# Patient Record
Sex: Female | Born: 1950 | Race: White | Hispanic: No | Marital: Single | Smoking: Never smoker
Health system: Southern US, Community
[De-identification: ages and names within clinical notes are randomized; demographics above are authoritative.]

## PROBLEM LIST (undated history)

## (undated) DIAGNOSIS — R011 Cardiac murmur, unspecified: Secondary | ICD-10-CM

## (undated) DIAGNOSIS — Z87442 Personal history of urinary calculi: Secondary | ICD-10-CM

## (undated) DIAGNOSIS — T4145XA Adverse effect of unspecified anesthetic, initial encounter: Secondary | ICD-10-CM

## (undated) DIAGNOSIS — I10 Essential (primary) hypertension: Secondary | ICD-10-CM

## (undated) DIAGNOSIS — I209 Angina pectoris, unspecified: Secondary | ICD-10-CM

## (undated) DIAGNOSIS — R519 Headache, unspecified: Secondary | ICD-10-CM

## (undated) DIAGNOSIS — M109 Gout, unspecified: Secondary | ICD-10-CM

## (undated) DIAGNOSIS — R51 Headache: Secondary | ICD-10-CM

## (undated) DIAGNOSIS — R600 Localized edema: Secondary | ICD-10-CM

## (undated) DIAGNOSIS — R002 Palpitations: Secondary | ICD-10-CM

## (undated) DIAGNOSIS — T8859XA Other complications of anesthesia, initial encounter: Secondary | ICD-10-CM

## (undated) HISTORY — PX: ABDOMINAL HYSTERECTOMY: SHX81

## (undated) HISTORY — PX: OOPHORECTOMY: SHX86

## (undated) HISTORY — PX: EYE SURGERY: SHX253

---

## 2004-09-19 ENCOUNTER — Emergency Department: Payer: Self-pay | Admitting: Unknown Physician Specialty

## 2004-09-28 ENCOUNTER — Inpatient Hospital Stay: Payer: Self-pay | Admitting: Otolaryngology

## 2005-07-13 ENCOUNTER — Other Ambulatory Visit: Payer: Self-pay

## 2005-07-17 ENCOUNTER — Ambulatory Visit: Payer: Self-pay

## 2006-12-19 ENCOUNTER — Emergency Department: Payer: Self-pay | Admitting: Emergency Medicine

## 2007-01-28 ENCOUNTER — Emergency Department: Payer: Self-pay | Admitting: Emergency Medicine

## 2007-01-28 ENCOUNTER — Other Ambulatory Visit: Payer: Self-pay

## 2009-11-20 ENCOUNTER — Emergency Department: Payer: Self-pay | Admitting: Emergency Medicine

## 2010-06-29 ENCOUNTER — Emergency Department: Payer: Self-pay | Admitting: Emergency Medicine

## 2010-11-04 ENCOUNTER — Emergency Department: Payer: Self-pay | Admitting: Internal Medicine

## 2012-12-09 ENCOUNTER — Ambulatory Visit: Payer: Self-pay | Admitting: Family Medicine

## 2015-09-19 MED ORDER — SCOPOLAMINE 1 MG/3DAYS TD PT72
MEDICATED_PATCH | TRANSDERMAL | Status: AC
  Filled 2015-09-19: qty 1

## 2015-09-27 ENCOUNTER — Encounter: Payer: Self-pay | Admitting: *Deleted

## 2015-10-04 ENCOUNTER — Encounter
Admission: RE | Disposition: A | Discharge status: Home / Self Care | Payer: Self-pay | Source: Ambulatory Visit | Attending: Ophthalmology

## 2015-10-04 ENCOUNTER — Ambulatory Visit: Payer: Medicare Other | Admitting: Anesthesiology

## 2015-10-04 ENCOUNTER — Encounter: Payer: Self-pay | Admitting: Anesthesiology

## 2015-10-04 ENCOUNTER — Ambulatory Visit
Admission: RE | Admit: 2015-10-04 | Discharge: 2015-10-04 | Disposition: A | Discharge status: Home / Self Care | Payer: Medicare Other | Source: Ambulatory Visit | Attending: Ophthalmology | Admitting: Ophthalmology

## 2015-10-04 DIAGNOSIS — I209 Angina pectoris, unspecified: Secondary | ICD-10-CM | POA: Diagnosis not present

## 2015-10-04 DIAGNOSIS — M109 Gout, unspecified: Secondary | ICD-10-CM | POA: Diagnosis not present

## 2015-10-04 DIAGNOSIS — G43909 Migraine, unspecified, not intractable, without status migrainosus: Secondary | ICD-10-CM | POA: Diagnosis not present

## 2015-10-04 DIAGNOSIS — M7989 Other specified soft tissue disorders: Secondary | ICD-10-CM | POA: Diagnosis not present

## 2015-10-04 DIAGNOSIS — Z90710 Acquired absence of both cervix and uterus: Secondary | ICD-10-CM | POA: Diagnosis not present

## 2015-10-04 DIAGNOSIS — R011 Cardiac murmur, unspecified: Secondary | ICD-10-CM | POA: Insufficient documentation

## 2015-10-04 DIAGNOSIS — E7800 Pure hypercholesterolemia, unspecified: Secondary | ICD-10-CM | POA: Diagnosis not present

## 2015-10-04 DIAGNOSIS — R002 Palpitations: Secondary | ICD-10-CM | POA: Insufficient documentation

## 2015-10-04 DIAGNOSIS — H2511 Age-related nuclear cataract, right eye: Secondary | ICD-10-CM | POA: Diagnosis present

## 2015-10-04 HISTORY — DX: Headache, unspecified: R51.9

## 2015-10-04 HISTORY — DX: Gout, unspecified: M10.9

## 2015-10-04 HISTORY — DX: Cardiac murmur, unspecified: R01.1

## 2015-10-04 HISTORY — DX: Localized edema: R60.0

## 2015-10-04 HISTORY — DX: Headache: R51

## 2015-10-04 HISTORY — DX: Palpitations: R00.2

## 2015-10-04 HISTORY — PX: CATARACT EXTRACTION W/PHACO: SHX586

## 2015-10-04 HISTORY — DX: Angina pectoris, unspecified: I20.9

## 2015-10-04 SURGERY — PHACOEMULSIFICATION, CATARACT, WITH IOL INSERTION
Anesthesia: Monitor Anesthesia Care | Site: Eye | Laterality: Right | Wound class: Clean

## 2015-10-04 MED ORDER — FENTANYL CITRATE (PF) 100 MCG/2ML IJ SOLN
INTRAMUSCULAR | Status: DC | PRN
  Administered 2015-10-04: 50 ug via INTRAVENOUS

## 2015-10-04 MED ORDER — EPINEPHRINE HCL 1 MG/ML IJ SOLN
INTRAMUSCULAR | Status: AC
  Filled 2015-10-04: qty 1

## 2015-10-04 MED ORDER — NA CHONDROIT SULF-NA HYALURON 40-17 MG/ML IO SOLN
INTRAOCULAR | Status: DC | PRN
  Administered 2015-10-04: 1 mL via INTRAOCULAR

## 2015-10-04 MED ORDER — TETRACAINE HCL 0.5 % OP SOLN
1.0000 [drp] | Freq: Once | OPHTHALMIC | Status: AC
  Administered 2015-10-04: 1 [drp] via OPHTHALMIC

## 2015-10-04 MED ORDER — MIDAZOLAM HCL 2 MG/2ML IJ SOLN
INTRAMUSCULAR | Status: DC | PRN
  Administered 2015-10-04 (×2): 1 mg via INTRAVENOUS

## 2015-10-04 MED ORDER — CEFUROXIME OPHTHALMIC INJECTION 1 MG/0.1 ML
INJECTION | OPHTHALMIC | Status: DC | PRN
  Administered 2015-10-04: 0.1 mL via INTRACAMERAL

## 2015-10-04 MED ORDER — CEFUROXIME OPHTHALMIC INJECTION 1 MG/0.1 ML
INJECTION | OPHTHALMIC | Status: AC
  Filled 2015-10-04: qty 0.1

## 2015-10-04 MED ORDER — MOXIFLOXACIN HCL 0.5 % OP SOLN
OPHTHALMIC | Status: AC
  Filled 2015-10-04: qty 3

## 2015-10-04 MED ORDER — EPINEPHRINE HCL 1 MG/ML IJ SOLN
INTRAOCULAR | Status: DC | PRN
  Administered 2015-10-04: 08:00:00 via OPHTHALMIC

## 2015-10-04 MED ORDER — SODIUM CHLORIDE 0.9 % IV SOLN
INTRAVENOUS | Status: DC
  Administered 2015-10-04 (×2): via INTRAVENOUS

## 2015-10-04 MED ORDER — MOXIFLOXACIN HCL 0.5 % OP SOLN
OPHTHALMIC | Status: DC | PRN
  Administered 2015-10-04: 1 [drp] via OPHTHALMIC

## 2015-10-04 MED ORDER — ARMC OPHTHALMIC DILATING GEL
1.0000 "application " | OPHTHALMIC | Status: DC | PRN
  Administered 2015-10-04 (×2): 1 via OPHTHALMIC

## 2015-10-04 MED ORDER — TETRACAINE HCL 0.5 % OP SOLN
OPHTHALMIC | Status: AC
  Filled 2015-10-04: qty 2

## 2015-10-04 MED ORDER — CARBACHOL 0.01 % IO SOLN
INTRAOCULAR | Status: DC | PRN
  Administered 2015-10-04: 0.5 mL via INTRAOCULAR

## 2015-10-04 MED ORDER — MOXIFLOXACIN HCL 0.5 % OP SOLN: 1.0000 [drp] | OPHTHALMIC | Status: DC | PRN

## 2015-10-04 MED ORDER — POVIDONE-IODINE 5 % OP SOLN
OPHTHALMIC | Status: AC
  Filled 2015-10-04: qty 30

## 2015-10-04 MED ORDER — NA CHONDROIT SULF-NA HYALURON 40-17 MG/ML IO SOLN
INTRAOCULAR | Status: AC
  Filled 2015-10-04: qty 1

## 2015-10-04 MED ORDER — POVIDONE-IODINE 5 % OP SOLN
1.0000 "application " | Freq: Once | OPHTHALMIC | Status: AC
  Administered 2015-10-04: 1 via OPHTHALMIC

## 2015-10-04 SURGICAL SUPPLY — 21 items
CANNULA ANT/CHMB 27GA (MISCELLANEOUS) ×3 IMPLANT
CUP MEDICINE 2OZ PLAST GRAD ST (MISCELLANEOUS) ×3 IMPLANT
GLOVE BIO SURGEON STRL SZ8 (GLOVE) ×3 IMPLANT
GLOVE BIOGEL M 6.5 STRL (GLOVE) ×3 IMPLANT
GLOVE SURG LX 8.0 MICRO (GLOVE) ×2
GLOVE SURG LX STRL 8.0 MICRO (GLOVE) ×1 IMPLANT
GOWN STRL REUS W/ TWL LRG LVL3 (GOWN DISPOSABLE) ×2 IMPLANT
GOWN STRL REUS W/TWL LRG LVL3 (GOWN DISPOSABLE) ×4
LENS IOL TECNIS ITEC 21.0 (Intraocular Lens) ×3 IMPLANT
PACK CATARACT (MISCELLANEOUS) ×3 IMPLANT
PACK CATARACT BRASINGTON LX (MISCELLANEOUS) ×3 IMPLANT
PACK EYE AFTER SURG (MISCELLANEOUS) ×3 IMPLANT
SOL BSS BAG (MISCELLANEOUS) ×3
SOL PREP PVP 2OZ (MISCELLANEOUS) ×3
SOLUTION BSS BAG (MISCELLANEOUS) ×1 IMPLANT
SOLUTION PREP PVP 2OZ (MISCELLANEOUS) ×1 IMPLANT
SYR 3ML LL SCALE MARK (SYRINGE) ×3 IMPLANT
SYR 5ML LL (SYRINGE) ×3 IMPLANT
SYR TB 1ML 27GX1/2 LL (SYRINGE) ×3 IMPLANT
WATER STERILE IRR 1000ML POUR (IV SOLUTION) ×3 IMPLANT
WIPE NON LINTING 3.25X3.25 (MISCELLANEOUS) ×3 IMPLANT

## 2015-10-04 NOTE — H&P (Signed)
  All labs reviewed. Abnormal studies sent to patients PCP when indicated.  Previous H&P reviewed, patient examined, there are NO CHANGES.  Heather Simmons LOUIS5/30/20177:47 AM

## 2015-10-04 NOTE — Anesthesia Postprocedure Evaluation (Incomplete)
Anesthesia Post Note  Patient: Heather Simmons  Procedure(s) Performed: Procedure(s) (LRB): CATARACT EXTRACTION PHACO AND INTRAOCULAR LENS PLACEMENT (IOC) (Right)  Anesthesia Post Evaluation  Last Vitals:  Filed Vitals:   10/04/15 0653  BP: 136/81  Pulse: 72  Temp: 36.9 C  Resp: 18    Last Pain: There were no vitals filed for this visit.               F[place],  Diego Cory

## 2015-10-04 NOTE — Anesthesia Preprocedure Evaluation (Signed)
Anesthesia Evaluation  Patient identified by MRN, date of birth, ID band Patient awake    Reviewed: Allergy & Precautions, H&P , NPO status , Patient's Chart, lab work & pertinent test results  History of Anesthesia Complications Negative for: history of anesthetic complications  Airway Mallampati: III  TM Distance: <3 FB Neck ROM: limited    Dental  (+) Poor Dentition, Chipped, Missing   Pulmonary neg pulmonary ROS, neg shortness of breath,    Pulmonary exam normal breath sounds clear to auscultation       Cardiovascular Exercise Tolerance: Good (-) angina(-) Past MI and (-) DOE Normal cardiovascular exam+ Valvular Problems/Murmurs  Rhythm:regular Rate:Normal     Neuro/Psych  Headaches, negative psych ROS   GI/Hepatic negative GI ROS, Neg liver ROS, neg GERD  ,  Endo/Other  negative endocrine ROS  Renal/GU negative Renal ROS  negative genitourinary   Musculoskeletal   Abdominal   Peds  Hematology negative hematology ROS (+)   Anesthesia Other Findings Past Medical History:   Heart murmur                                                 Headache                                                       Comment:MIGRAINES   Gout                                                         Anginal pain (HCC)                                           Palpitations                                                 Fluid collection (edema) in the arms, legs, ha*             Past Surgical History:   ABDOMINAL HYSTERECTOMY                                       BMI    Body Mass Index   42.58 kg/m 2      Reproductive/Obstetrics negative OB ROS                             Anesthesia Physical Anesthesia Plan  ASA: III  Anesthesia Plan: MAC   Post-op Pain Management:    Induction:   Airway Management Planned:   Additional Equipment:   Intra-op Plan:   Post-operative Plan:   Informed  Consent: I have reviewed the patients History and Physical, chart, labs  and discussed the procedure including the risks, benefits and alternatives for the proposed anesthesia with the patient or authorized representative who has indicated his/her understanding and acceptance.   Dental Advisory Given  Plan Discussed with: Anesthesiologist, CRNA and Surgeon  Anesthesia Plan Comments:         Anesthesia Quick Evaluation

## 2015-10-04 NOTE — Anesthesia Postprocedure Evaluation (Signed)
Anesthesia Post Note  Patient: Heather Simmons  Procedure(s) Performed: Procedure(s) (LRB): CATARACT EXTRACTION PHACO AND INTRAOCULAR LENS PLACEMENT (IOC) (Right)  Patient location during evaluation: PACU Anesthesia Type: MAC Level of consciousness: awake and alert Pain management: pain level controlled Vital Signs Assessment: post-procedure vital signs reviewed and stable Respiratory status: spontaneous breathing, nonlabored ventilation, respiratory function stable and patient connected to nasal cannula oxygen Cardiovascular status: stable and blood pressure returned to baseline Anesthetic complications: no    Last Vitals:  Filed Vitals:   10/04/15 0814 10/04/15 0822  BP: 136/70 133/73  Pulse: 64   Temp: 36.7 C   Resp: 18     Last Pain: There were no vitals filed for this visit.               Precious Haws Lamario Mani

## 2015-10-04 NOTE — Op Note (Signed)
PREOPERATIVE DIAGNOSIS:  Nuclear sclerotic cataract of the right eye.   POSTOPERATIVE DIAGNOSIS: NUCLEAR SCLEROTIC CATARACT RIGHT EYE   OPERATIVE PROCEDURE:  Procedure(s): CATARACT EXTRACTION PHACO AND INTRAOCULAR LENS PLACEMENT (IOC)   SURGEON:  Birder Robson, MD.   ANESTHESIA:  Anesthesiologist: Andria Frames, MD CRNA: Jenetta Downer, CRNA  1.      Managed anesthesia care. 2.      Topical tetracaine drops followed by 2% Xylocaine jelly applied in the preoperative holding area.   COMPLICATIONS:  None.   TECHNIQUE:   Stop and chop   DESCRIPTION OF PROCEDURE:  The patient was examined and consented in the preoperative holding area where the aforementioned topical anesthesia was applied to the right eye and then brought back to the Operating Room where the right eye was prepped and draped in the usual sterile ophthalmic fashion and a lid speculum was placed. A paracentesis was created with the side port blade and the anterior chamber was filled with viscoelastic. A near clear corneal incision was performed with the steel keratome. A continuous curvilinear capsulorrhexis was performed with a cystotome followed by the capsulorrhexis forceps. Hydrodissection and hydrodelineation were carried out with BSS on a blunt cannula. The lens was removed in a stop and chop  technique and the remaining cortical material was removed with the irrigation-aspiration handpiece. The capsular bag was inflated with viscoelastic and the Technis [id number]  lens was placed in the capsular bag without complication. The remaining viscoelastic was removed from the eye with the irrigation-aspiration handpiece. The wounds were hydrated. The anterior chamber was flushed with Miostat and the eye was inflated to physiologic pressure. 0.1 mL of cefuroxime concentration 10 mg/mL was placed in the anterior chamber. The wounds were found to be water tight. The eye was dressed with Vigamox. The patient was given  protective glasses to wear throughout the day and a shield with which to sleep tonight. The patient was also given drops with which to begin a drop regimen today and will follow-up with me in one day.  Implant Name Type Inv. Item Serial No. Manufacturer Lot No. LRB No. Used  LENS IOL DIOP 21.0 - [id number] Intraocular Lens LENS IOL DIOP 21.0 [id number]   Right 1   Procedure(s) with comments: CATARACT EXTRACTION PHACO AND INTRAOCULAR LENS PLACEMENT (IOC) (Right) - [place] 36.0 AP% 21.9 CDE 7.88 fluid pack lot # [id number]  Electronically signed: [place] 10/04/2015 8:11 AM

## 2015-10-04 NOTE — Transfer of Care (Signed)
Immediate Anesthesia Transfer of Care Note  Patient: DANNYELLE GROVES  Procedure(s) Performed: Procedure(s) with comments: CATARACT EXTRACTION PHACO AND INTRAOCULAR LENS PLACEMENT (IOC) (Right) - [place] 36.0 AP% 21.9 CDE 7.88 fluid pack lot # [id number]  Patient Location: PACU  Anesthesia Type:MAC  Level of Consciousness: awake, alert  and oriented  Airway & Oxygen Therapy: Patient Spontanous Breathing and Patient connected to nasal cannula oxygen  Post-op Assessment: Report given to RN and Post -op Vital signs reviewed and stable  Post vital signs: Reviewed and stable  Last Vitals:  Filed Vitals:   10/04/15 0653  BP: 136/81  Pulse: 72  Temp: 36.9 C  Resp: 18    Last Pain: There were no vitals filed for this visit.       Complications: No apparent anesthesia complications

## 2015-10-04 NOTE — Discharge Instructions (Signed)
Eye Surgery Discharge Instructions  Expect mild scratchy sensation or mild soreness. DO NOT RUB YOUR EYE!  The day of surgery:  Minimal physical activity, but bed rest is not required  No reading, computer work, or close hand work  No bending, lifting, or straining.  May watch TV  For 24 hours:  No driving, legal decisions, or alcoholic beverages  Safety precautions  Eat anything you prefer: It is better to start with liquids, then soup then solid foods.  _____ Eye patch should be worn until postoperative exam tomorrow.  ____ Solar shield eyeglasses should be worn for comfort in the sunlight/patch while sleeping  Resume all regular medications including aspirin or Coumadin if these were discontinued prior to surgery. You may shower, bathe, shave, or wash your hair. Tylenol may be taken for mild discomfort.  Call your doctor if you experience significant pain, nausea, or vomiting, fever > 101 or other signs of infection. [phone] or [phone] Specific instructions:  Follow-up Information    Follow up with Tim Lair, MD On 10/05/2015.   Specialty:  Ophthalmology   Why:  8:10   Contact information:   [address] [place] [address] [phone]

## 2015-10-25 ENCOUNTER — Encounter: Payer: Self-pay | Admitting: *Deleted

## 2015-11-01 ENCOUNTER — Ambulatory Visit
Admission: RE | Admit: 2015-11-01 | Discharge: 2015-11-01 | Disposition: A | Discharge status: Home / Self Care | Payer: Medicare Other | Source: Ambulatory Visit | Attending: Ophthalmology | Admitting: Ophthalmology

## 2015-11-01 ENCOUNTER — Encounter: Payer: Self-pay | Admitting: *Deleted

## 2015-11-01 ENCOUNTER — Ambulatory Visit: Payer: Medicare Other | Admitting: Anesthesiology

## 2015-11-01 ENCOUNTER — Encounter
Admission: RE | Disposition: A | Discharge status: Home / Self Care | Payer: Self-pay | Source: Ambulatory Visit | Attending: Ophthalmology

## 2015-11-01 DIAGNOSIS — H2512 Age-related nuclear cataract, left eye: Secondary | ICD-10-CM | POA: Diagnosis present

## 2015-11-01 DIAGNOSIS — I10 Essential (primary) hypertension: Secondary | ICD-10-CM | POA: Insufficient documentation

## 2015-11-01 DIAGNOSIS — M109 Gout, unspecified: Secondary | ICD-10-CM | POA: Insufficient documentation

## 2015-11-01 HISTORY — DX: Adverse effect of unspecified anesthetic, initial encounter: T41.45XA

## 2015-11-01 HISTORY — PX: CATARACT EXTRACTION W/PHACO: SHX586

## 2015-11-01 HISTORY — DX: Other complications of anesthesia, initial encounter: T88.59XA

## 2015-11-01 SURGERY — PHACOEMULSIFICATION, CATARACT, WITH IOL INSERTION
Anesthesia: Monitor Anesthesia Care | Site: Eye | Laterality: Left | Wound class: Clean

## 2015-11-01 MED ORDER — MIDAZOLAM HCL 2 MG/2ML IJ SOLN
INTRAMUSCULAR | Status: DC | PRN
  Administered 2015-11-01: 1 mg via INTRAVENOUS

## 2015-11-01 MED ORDER — NA CHONDROIT SULF-NA HYALURON 40-17 MG/ML IO SOLN
INTRAOCULAR | Status: DC | PRN
Start: 2015-11-01 — End: 2015-11-01
  Administered 2015-11-01: 1 mL via INTRAOCULAR

## 2015-11-01 MED ORDER — TETRACAINE HCL 0.5 % OP SOLN
OPHTHALMIC | Status: AC
  Filled 2015-11-01: qty 2

## 2015-11-01 MED ORDER — POVIDONE-IODINE 5 % OP SOLN
OPHTHALMIC | Status: DC | PRN
  Administered 2015-11-01: 1 via OPHTHALMIC

## 2015-11-01 MED ORDER — POVIDONE-IODINE 5 % OP SOLN
OPHTHALMIC | Status: AC
  Filled 2015-11-01: qty 30

## 2015-11-01 MED ORDER — EPINEPHRINE HCL 1 MG/ML IJ SOLN
INTRAMUSCULAR | Status: AC
  Filled 2015-11-01: qty 1

## 2015-11-01 MED ORDER — CEFUROXIME OPHTHALMIC INJECTION 1 MG/0.1 ML
INJECTION | OPHTHALMIC | Status: DC | PRN
Start: 2015-11-01 — End: 2015-11-01
  Administered 2015-11-01: .1 mL via INTRACAMERAL

## 2015-11-01 MED ORDER — POVIDONE-IODINE 5 % OP SOLN
1.0000 | Freq: Once | OPHTHALMIC | Status: AC
Start: 2015-11-01 — End: 2015-11-01
  Administered 2015-11-01: 1 via OPHTHALMIC

## 2015-11-01 MED ORDER — CEFUROXIME OPHTHALMIC INJECTION 1 MG/0.1 ML
INJECTION | OPHTHALMIC | Status: AC
  Filled 2015-11-01: qty 0.1

## 2015-11-01 MED ORDER — ARMC OPHTHALMIC DILATING GEL
1.0000 "application " | Freq: Once | OPHTHALMIC | Status: AC
  Administered 2015-11-01: 1 via OPHTHALMIC

## 2015-11-01 MED ORDER — ARMC OPHTHALMIC DILATING GEL
OPHTHALMIC | Status: AC
  Filled 2015-11-01: qty 0.25

## 2015-11-01 MED ORDER — NA CHONDROIT SULF-NA HYALURON 40-17 MG/ML IO SOLN
INTRAOCULAR | Status: AC
  Filled 2015-11-01: qty 1

## 2015-11-01 MED ORDER — CARBACHOL 0.01 % IO SOLN
INTRAOCULAR | Status: DC | PRN
  Administered 2015-11-01: .5 mL via INTRAOCULAR

## 2015-11-01 MED ORDER — EPINEPHRINE HCL 1 MG/ML IJ SOLN
INTRAOCULAR | Status: DC | PRN
  Administered 2015-11-01: 1 mL via OPHTHALMIC

## 2015-11-01 MED ORDER — SODIUM CHLORIDE 0.9 % IV SOLN
INTRAVENOUS | Status: DC
  Administered 2015-11-01: 10:00:00 via INTRAVENOUS

## 2015-11-01 MED ORDER — MOXIFLOXACIN HCL 0.5 % OP SOLN: 1.0000 [drp] | Freq: Once | OPHTHALMIC | Status: DC

## 2015-11-01 MED ORDER — MOXIFLOXACIN HCL 0.5 % OP SOLN
OPHTHALMIC | Status: DC | PRN
Start: 2015-11-01 — End: 2015-11-01
  Administered 2015-11-01: 1 [drp] via OPHTHALMIC

## 2015-11-01 MED ORDER — ALFENTANIL 500 MCG/ML IJ INJ
INJECTION | INTRAMUSCULAR | Status: DC | PRN
  Administered 2015-11-01: 250 ug via INTRAVENOUS

## 2015-11-01 MED ORDER — TETRACAINE HCL 0.5 % OP SOLN
1.0000 [drp] | Freq: Once | OPHTHALMIC | Status: AC
  Administered 2015-11-01: 1 [drp] via OPHTHALMIC

## 2015-11-01 MED ORDER — MOXIFLOXACIN HCL 0.5 % OP SOLN
OPHTHALMIC | Status: AC
  Filled 2015-11-01: qty 3

## 2015-11-01 SURGICAL SUPPLY — 21 items
CANNULA ANT/CHMB 27GA (MISCELLANEOUS) ×3 IMPLANT
CUP MEDICINE 2OZ PLAST GRAD ST (MISCELLANEOUS) ×3 IMPLANT
GLOVE BIO SURGEON STRL SZ8 (GLOVE) ×3 IMPLANT
GLOVE BIOGEL M 6.5 STRL (GLOVE) ×3 IMPLANT
GLOVE SURG LX 8.0 MICRO (GLOVE) ×2
GLOVE SURG LX STRL 8.0 MICRO (GLOVE) ×1 IMPLANT
GOWN STRL REUS W/ TWL LRG LVL3 (GOWN DISPOSABLE) ×2 IMPLANT
GOWN STRL REUS W/TWL LRG LVL3 (GOWN DISPOSABLE) ×4
LENS IOL TECNIS ITEC 21.0 (Intraocular Lens) ×3 IMPLANT
PACK CATARACT (MISCELLANEOUS) ×3 IMPLANT
PACK CATARACT BRASINGTON LX (MISCELLANEOUS) ×3 IMPLANT
PACK EYE AFTER SURG (MISCELLANEOUS) ×3 IMPLANT
SOL BSS BAG (MISCELLANEOUS) ×3
SOL PREP PVP 2OZ (MISCELLANEOUS) ×3
SOLUTION BSS BAG (MISCELLANEOUS) ×1 IMPLANT
SOLUTION PREP PVP 2OZ (MISCELLANEOUS) ×1 IMPLANT
SYR 3ML LL SCALE MARK (SYRINGE) ×3 IMPLANT
SYR 5ML LL (SYRINGE) ×3 IMPLANT
SYR TB 1ML 27GX1/2 LL (SYRINGE) ×3 IMPLANT
WATER STERILE IRR 1000ML POUR (IV SOLUTION) ×3 IMPLANT
WIPE NON LINTING 3.25X3.25 (MISCELLANEOUS) ×3 IMPLANT

## 2015-11-01 NOTE — Op Note (Signed)
PREOPERATIVE DIAGNOSIS:  Nuclear sclerotic cataract of the left eye.   POSTOPERATIVE DIAGNOSIS:  nuclear sclerotic cataract left eye   OPERATIVE PROCEDURE:  Procedure(s): CATARACT EXTRACTION PHACO AND INTRAOCULAR LENS PLACEMENT (IOC)   SURGEON:  Birder Robson, MD.   ANESTHESIA:   Anesthesiologist: Gunnar Bulla, MD CRNA: Jonna Clark, CRNA; Courtney Paris, CRNA  1.      Managed anesthesia care. 2.      Topical tetracaine drops followed by 2% Xylocaine jelly applied in the preoperative holding area.   COMPLICATIONS:  None.   TECHNIQUE:   Stop and chop   DESCRIPTION OF PROCEDURE:  The patient was examined and consented in the preoperative holding area where the aforementioned topical anesthesia was applied to the left eye and then brought back to the Operating Room where the left eye was prepped and draped in the usual sterile ophthalmic fashion and a lid speculum was placed. A paracentesis was created with the side port blade and the anterior chamber was filled with viscoelastic. A near clear corneal incision was performed with the steel keratome. A continuous curvilinear capsulorrhexis was performed with a cystotome followed by the capsulorrhexis forceps. Hydrodissection and hydrodelineation were carried out with BSS on a blunt cannula. The lens was removed in a stop and chop  technique and the remaining cortical material was removed with the irrigation-aspiration handpiece. The capsular bag was inflated with viscoelastic and the Technis ZCB00 lens was placed in the capsular bag without complication. The remaining viscoelastic was removed from the eye with the irrigation-aspiration handpiece. The wounds were hydrated. The anterior chamber was flushed with Miostat and the eye was inflated to physiologic pressure. 0.1 mL of cefuroxime concentration 10 mg/mL was placed in the anterior chamber. The wounds were found to be water tight. The eye was dressed with Vigamox. The patient was given  protective glasses to wear throughout the day and a shield with which to sleep tonight. The patient was also given drops with which to begin a drop regimen today and will follow-up with me in one day.  Implant Name Type Inv. Item Serial No. Manufacturer Lot No. LRB No. Used  LENS IOL DIOP 21.0 - [id number] Intraocular Lens LENS IOL DIOP 21.0 [id number]   Left 1   Procedure(s) with comments: CATARACT EXTRACTION PHACO AND INTRAOCULAR LENS PLACEMENT (IOC) (Left) - [place] 00:33 AP% 23.1 CDE 7.76 fluid pack lot # [id number]  Electronically signed: [place] 11/01/2015 11:36 AM

## 2015-11-01 NOTE — Transfer of Care (Signed)
Immediate Anesthesia Transfer of Care Note  Patient: HEELA FRYDMAN  Procedure(s) Performed: Procedure(s) with comments: CATARACT EXTRACTION PHACO AND INTRAOCULAR LENS PLACEMENT (IOC) (Left) - [place] 00:33 AP% 23.1 CDE 7.76 fluid pack lot # [id number]  Patient Location: PACU and Short Stay  Anesthesia Type:MAC  Level of Consciousness: awake, oriented and patient cooperative  Airway & Oxygen Therapy: Patient Spontanous Breathing and Patient connected to nasal cannula oxygen  Post-op Assessment: Report given to RN and Post -op Vital signs reviewed and stable  Post vital signs: stable  Last Vitals:  Filed Vitals:   11/01/15 1013  BP: 130/72  Pulse: 64  Temp: 36.6 C  Resp: 18    Last Pain: There were no vitals filed for this visit.       Complications: No apparent anesthesia complications

## 2015-11-01 NOTE — H&P (Signed)
  All labs reviewed. Abnormal studies sent to patients PCP when indicated.  Previous H&P reviewed, patient examined, there are NO CHANGES.  Heather Weed LOUIS6/27/201711:10 AM

## 2015-11-01 NOTE — Anesthesia Preprocedure Evaluation (Addendum)
Anesthesia Evaluation  Patient identified by MRN, date of birth, ID band Patient awake    Reviewed: Allergy & Precautions, NPO status , Patient's Chart, lab work & pertinent test results, reviewed documented beta blocker date and time   Airway Mallampati: III  TM Distance: >3 FB     Dental  (+) Chipped   Pulmonary           Cardiovascular hypertension, Pt. on medications and Pt. on home beta blockers + angina      Neuro/Psych  Headaches,    GI/Hepatic   Endo/Other  Morbid obesity  Renal/GU      Musculoskeletal   Abdominal   Peds  Hematology   Anesthesia Other Findings Gout.  Reproductive/Obstetrics                           Anesthesia Physical Anesthesia Plan  ASA: III  Anesthesia Plan: MAC   Post-op Pain Management:    Induction:   Airway Management Planned:   Additional Equipment:   Intra-op Plan:   Post-operative Plan:   Informed Consent: I have reviewed the patients History and Physical, chart, labs and discussed the procedure including the risks, benefits and alternatives for the proposed anesthesia with the patient or authorized representative who has indicated his/her understanding and acceptance.     Plan Discussed with: CRNA  Anesthesia Plan Comments:         Anesthesia Quick Evaluation

## 2015-11-01 NOTE — Discharge Instructions (Signed)
Eye Surgery Discharge Instructions  Expect mild scratchy sensation or mild soreness. DO NOT RUB YOUR EYE!  The day of surgery:  Minimal physical activity, but bed rest is not required  No reading, computer work, or close hand work  No bending, lifting, or straining.  May watch TV  For 24 hours:  No driving, legal decisions, or alcoholic beverages  Safety precautions  Eat anything you prefer: It is better to start with liquids, then soup then solid foods.  _____ Eye patch should be worn until postoperative exam tomorrow.  ____ Solar shield eyeglasses should be worn for comfort in the sunlight/patch while sleeping  Resume all regular medications including aspirin or Coumadin if these were discontinued prior to surgery. You may shower, bathe, shave, or wash your hair. Tylenol may be taken for mild discomfort.  Call your doctor if you experience significant pain, nausea, or vomiting, fever > 101 or other signs of infection. [phone] or [phone] Specific instructions:  Follow-up Information    Follow up with Tim Lair, MD On 11/02/2015.   Specialty:  Ophthalmology   Why:  10:40   Contact information:   [address] [place] [address] [phone]

## 2015-11-01 NOTE — Anesthesia Postprocedure Evaluation (Signed)
Anesthesia Post Note  Patient: Heather Simmons  Procedure(s) Performed: Procedure(s) (LRB): CATARACT EXTRACTION PHACO AND INTRAOCULAR LENS PLACEMENT (IOC) (Left)  Patient location during evaluation: Other Anesthesia Type: General Level of consciousness: awake and alert Pain management: pain level controlled Vital Signs Assessment: post-procedure vital signs reviewed and stable Respiratory status: spontaneous breathing, nonlabored ventilation, respiratory function stable and patient connected to nasal cannula oxygen Cardiovascular status: blood pressure returned to baseline and stable Postop Assessment: no signs of nausea or vomiting Anesthetic complications: no    Last Vitals:  Filed Vitals:   11/01/15 1140 11/01/15 1157  BP: 138/73 131/72  Pulse: 62 59  Temp: 36.8 C   Resp: 16 16    Last Pain: There were no vitals filed for this visit.               Arlesia Kiel S

## 2016-09-11 ENCOUNTER — Other Ambulatory Visit: Payer: Self-pay | Admitting: Family Medicine

## 2016-09-11 DIAGNOSIS — Z1231 Encounter for screening mammogram for malignant neoplasm of breast: Secondary | ICD-10-CM

## 2016-10-02 ENCOUNTER — Ambulatory Visit
Admission: RE | Admit: 2016-10-02 | Discharge: 2016-10-02 | Disposition: A | Discharge status: Home / Self Care | Payer: Medicare Other | Source: Ambulatory Visit | Attending: Family Medicine | Admitting: Family Medicine

## 2016-10-02 DIAGNOSIS — Z1231 Encounter for screening mammogram for malignant neoplasm of breast: Secondary | ICD-10-CM | POA: Insufficient documentation

## 2017-11-13 ENCOUNTER — Other Ambulatory Visit: Payer: Self-pay | Admitting: Family Medicine

## 2017-11-13 DIAGNOSIS — Z1231 Encounter for screening mammogram for malignant neoplasm of breast: Secondary | ICD-10-CM

## 2017-12-03 ENCOUNTER — Ambulatory Visit
Admission: RE | Admit: 2017-12-03 | Discharge: 2017-12-03 | Disposition: A | Discharge status: Home / Self Care | Payer: Medicare Other | Source: Ambulatory Visit | Attending: Family Medicine | Admitting: Family Medicine

## 2017-12-03 ENCOUNTER — Encounter: Payer: Self-pay | Admitting: Radiology

## 2017-12-03 DIAGNOSIS — Z1231 Encounter for screening mammogram for malignant neoplasm of breast: Secondary | ICD-10-CM | POA: Insufficient documentation

## 2019-12-31 ENCOUNTER — Other Ambulatory Visit: Payer: Self-pay | Admitting: Family Medicine

## 2019-12-31 DIAGNOSIS — Z1231 Encounter for screening mammogram for malignant neoplasm of breast: Secondary | ICD-10-CM

## 2020-01-27 ENCOUNTER — Other Ambulatory Visit: Payer: Self-pay

## 2020-01-27 ENCOUNTER — Ambulatory Visit
Admission: RE | Admit: 2020-01-27 | Discharge: 2020-01-27 | Disposition: A | Discharge status: Home / Self Care | Payer: Medicare Other | Source: Ambulatory Visit | Attending: Family Medicine | Admitting: Family Medicine

## 2020-01-27 DIAGNOSIS — Z1231 Encounter for screening mammogram for malignant neoplasm of breast: Secondary | ICD-10-CM | POA: Diagnosis not present

## 2020-06-17 ENCOUNTER — Emergency Department
Admission: EM | Admit: 2020-06-17 | Discharge: 2020-06-17 | Disposition: A | Discharge status: Home / Self Care | Payer: Medicare Other | Source: Home / Self Care | Attending: Emergency Medicine | Admitting: Emergency Medicine

## 2020-06-17 ENCOUNTER — Emergency Department: Payer: Medicare Other

## 2020-06-17 ENCOUNTER — Other Ambulatory Visit: Payer: Self-pay

## 2020-06-17 DIAGNOSIS — Z20822 Contact with and (suspected) exposure to covid-19: Secondary | ICD-10-CM | POA: Insufficient documentation

## 2020-06-17 DIAGNOSIS — A419 Sepsis, unspecified organism: Secondary | ICD-10-CM | POA: Insufficient documentation

## 2020-06-17 DIAGNOSIS — N390 Urinary tract infection, site not specified: Secondary | ICD-10-CM | POA: Insufficient documentation

## 2020-06-17 DIAGNOSIS — A4151 Sepsis due to Escherichia coli [E. coli]: Secondary | ICD-10-CM | POA: Diagnosis not present

## 2020-06-17 DIAGNOSIS — R7881 Bacteremia: Secondary | ICD-10-CM | POA: Diagnosis not present

## 2020-06-17 LAB — CBC WITH DIFFERENTIAL/PLATELET
Abs Immature Granulocytes: 0.03 10*3/uL (ref 0.00–0.07)
Basophils Absolute: 0 10*3/uL (ref 0.0–0.1)
Basophils Relative: 0 %
Eosinophils Absolute: 0.1 10*3/uL (ref 0.0–0.5)
Eosinophils Relative: 1 %
HCT: 46.3 % — ABNORMAL HIGH (ref 36.0–46.0)
Hemoglobin: 15.1 g/dL — ABNORMAL HIGH (ref 12.0–15.0)
Immature Granulocytes: 1 %
Lymphocytes Relative: 7 %
Lymphs Abs: 0.3 10*3/uL — ABNORMAL LOW (ref 0.7–4.0)
MCH: 31.5 pg (ref 26.0–34.0)
MCHC: 32.6 g/dL (ref 30.0–36.0)
MCV: 96.7 fL (ref 80.0–100.0)
Monocytes Absolute: 0 10*3/uL — ABNORMAL LOW (ref 0.1–1.0)
Monocytes Relative: 0 %
Neutro Abs: 4.6 10*3/uL (ref 1.7–7.7)
Neutrophils Relative %: 91 %
Platelets: 233 10*3/uL (ref 150–400)
RBC: 4.79 MIL/uL (ref 3.87–5.11)
RDW: 12.4 % (ref 11.5–15.5)
Smear Review: NORMAL
WBC: 5.1 10*3/uL (ref 4.0–10.5)
nRBC: 0 % (ref 0.0–0.2)

## 2020-06-17 LAB — LIPASE, BLOOD: Lipase: 25 U/L (ref 11–51)

## 2020-06-17 LAB — URINALYSIS, COMPLETE (UACMP) WITH MICROSCOPIC
Bilirubin Urine: NEGATIVE
Glucose, UA: NEGATIVE mg/dL
Ketones, ur: NEGATIVE mg/dL
Nitrite: NEGATIVE
Protein, ur: NEGATIVE mg/dL
Specific Gravity, Urine: 1.01 (ref 1.005–1.030)
pH: 5 (ref 5.0–8.0)

## 2020-06-17 LAB — COMPREHENSIVE METABOLIC PANEL
ALT: 18 U/L (ref 0–44)
AST: 23 U/L (ref 15–41)
Albumin: 4 g/dL (ref 3.5–5.0)
Alkaline Phosphatase: 129 U/L — ABNORMAL HIGH (ref 38–126)
Anion gap: 11 (ref 5–15)
BUN: 22 mg/dL (ref 8–23)
CO2: 23 mmol/L (ref 22–32)
Calcium: 9.2 mg/dL (ref 8.9–10.3)
Chloride: 106 mmol/L (ref 98–111)
Creatinine, Ser: 1.46 mg/dL — ABNORMAL HIGH (ref 0.44–1.00)
GFR, Estimated: 38 mL/min — ABNORMAL LOW (ref >60)
Glucose, Bld: 112 mg/dL — ABNORMAL HIGH (ref 70–99)
Potassium: 4 mmol/L (ref 3.5–5.1)
Sodium: 140 mmol/L (ref 135–145)
Total Bilirubin: 1.1 mg/dL (ref 0.3–1.2)
Total Protein: 8.2 g/dL — ABNORMAL HIGH (ref 6.5–8.1)

## 2020-06-17 LAB — RESP PANEL BY RT-PCR (FLU A&B, COVID) ARPGX2
Influenza A by PCR: NEGATIVE
Influenza B by PCR: NEGATIVE
SARS Coronavirus 2 by RT PCR: NEGATIVE

## 2020-06-17 LAB — LACTIC ACID, PLASMA
Lactic Acid, Venous: 2.1 mmol/L (ref 0.5–1.9)
Lactic Acid, Venous: 1.9 mmol/L (ref 0.5–1.9)

## 2020-06-17 LAB — PROTIME-INR
INR: 1 (ref 0.8–1.2)
Prothrombin Time: 12.8 seconds (ref 11.4–15.2)

## 2020-06-17 LAB — CULTURE, BLOOD (ROUTINE X 2)

## 2020-06-17 MED ORDER — SODIUM CHLORIDE 0.9 % IV BOLUS: 1000.0000 mL | Freq: Once | INTRAVENOUS | Status: DC

## 2020-06-17 MED ORDER — SODIUM CHLORIDE 0.9 % IV SOLN
1.0000 g | Freq: Once | INTRAVENOUS | Status: AC
  Administered 2020-06-17: 1 g via INTRAVENOUS
  Filled 2020-06-17: qty 10

## 2020-06-17 MED ORDER — ONDANSETRON 4 MG PO TBDP: 4.0000 mg | ORAL_TABLET | Freq: Three times a day (TID) | ORAL | 0 refills | Status: DC | PRN

## 2020-06-17 MED ORDER — ACETAMINOPHEN 325 MG PO TABS
325.0000 mg | ORAL_TABLET | Freq: Once | ORAL | Status: AC
  Administered 2020-06-17: 325 mg via ORAL
  Filled 2020-06-17: qty 1

## 2020-06-17 MED ORDER — SODIUM CHLORIDE 0.9 % IV BOLUS
1000.0000 mL | Freq: Once | INTRAVENOUS | Status: AC
  Administered 2020-06-17: 1000 mL via INTRAVENOUS

## 2020-06-17 MED ORDER — ONDANSETRON HCL 4 MG/2ML IJ SOLN
4.0000 mg | INTRAMUSCULAR | Status: AC
  Administered 2020-06-17: 4 mg via INTRAVENOUS
  Filled 2020-06-17: qty 2

## 2020-06-17 MED ORDER — CEFDINIR 300 MG PO CAPS: 300.0000 mg | ORAL_CAPSULE | Freq: Two times a day (BID) | ORAL | 0 refills | Status: AC

## 2020-06-17 MED ORDER — ACETAMINOPHEN 325 MG PO TABS
650.0000 mg | ORAL_TABLET | Freq: Once | ORAL | Status: AC | PRN
  Administered 2020-06-17: 650 mg via ORAL
  Filled 2020-06-17: qty 2

## 2020-06-17 MED ORDER — SODIUM CHLORIDE 0.9 % IV BOLUS: 500.0000 mL | Freq: Once | INTRAVENOUS | Status: DC

## 2020-06-17 MED ORDER — ONDANSETRON HCL 4 MG/2ML IJ SOLN
4.0000 mg | Freq: Once | INTRAMUSCULAR | Status: AC | PRN
  Administered 2020-06-17: 4 mg via INTRAVENOUS
  Filled 2020-06-17: qty 2

## 2020-06-17 NOTE — ED Notes (Signed)
Patient transported to X-ray 

## 2020-06-17 NOTE — ED Provider Notes (Signed)
Care of this patient was signed out to me at the end of the previous provider shift pending laboratory results and p.o. challenge.  All pertinent patient information was conveyed and all questions were answered.  Patient received full dose of IV fluids and was p.o. tolerant following treatment with antiemetics.  The patient has been reexamined and is ready to be discharged.  All diagnostic results have been reviewed and discussed with the patient/family.  Care plan has been outlined and the patient/family understands all current diagnoses, results, and treatment plans.  There are no new complaints, changes, or physical findings at this time.  All questions have been addressed and answered.  All medications, if any, that were given while in the emergency department or any that are being prescribed have been reviewed with the patient/family.  All side effects and adverse reactions have been explained.  Patient was instructed to, and agrees to follow-up with their primary care physician as well as return to the emergency department if any new or worsening symptoms develop.   Naaman Plummer, MD 06/17/20 [phone]

## 2020-06-17 NOTE — ED Notes (Signed)
Pt nauseous and had 1 episode of emesis in traige

## 2020-06-17 NOTE — ED Provider Notes (Signed)
[facility] Emergency Department Provider Note   ____________________________________________   Event Date/Time   First MD Initiated Contact with Patient 06/17/20 1415     (approximate)  I have reviewed the triage vital signs and the nursing notes.   HISTORY  Chief Complaint Urinary Frequency    HPI Heather Simmons is a 70 y.o. female history of angina, migraines  Patient presents today for concerns of pain and burning with urination.  Reports increased urinary frequency.  Symptoms all developing today with associated nausea and reports she did vomit once or twice earlier   And she reports he knows she had a fever when she got here.  She thinks she might have a urinary tract infection.  Denies abdominal pain except she reports that sore when she urinates as well as foul odor  No cough, no trouble breathing.  Denies abdominal pain.   Past Medical History:  Diagnosis Date  . Anginal pain ([place])   . Complication of anesthesia    Nausea postoperatively with cataract surgery  . Fluid collection (edema) in the arms, legs, hands and feet   . Gout   . Headache    MIGRAINES  . Heart murmur   . Palpitations     There are no problems to display for this patient.   Past Surgical History:  Procedure Laterality Date  . ABDOMINAL HYSTERECTOMY    . CATARACT EXTRACTION W/PHACO Right 10/04/2015   Procedure: CATARACT EXTRACTION PHACO AND INTRAOCULAR LENS PLACEMENT (IOC);  Surgeon: Birder Robson, MD;  Location: [facility] ORS;  Service: Ophthalmology;  Laterality: Right;  [place] 36.0 AP% 21.9 CDE 7.88 fluid pack lot # [id number]  . CATARACT EXTRACTION W/PHACO Left 11/01/2015   Procedure: CATARACT EXTRACTION PHACO AND INTRAOCULAR LENS PLACEMENT (IOC);  Surgeon: Birder Robson, MD;  Location: [facility] ORS;  Service: Ophthalmology;  Laterality: Left;  [place] 00:33 AP% 23.1 CDE 7.76 fluid pack lot # [id number]  . OOPHORECTOMY      Prior to Admission medications   Medication  Sig Start Date End Date Taking? Authorizing Provider  allopurinol (ZYLOPRIM) 100 MG tablet Take 100 mg by mouth daily.    [provider]  amLODipine (NORVASC) 10 MG tablet Take 10 mg by mouth at bedtime. HS    [provider]  carvedilol (COREG) 6.25 MG tablet Take 6.25 mg by mouth 2 (two) times daily with a meal.    [provider]  losartan (COZAAR) 100 MG tablet Take 100 mg by mouth at bedtime.     [provider]  topiramate (TOPAMAX) 25 MG tablet Take 25 mg by mouth at bedtime. HS    [provider]    Allergies Lisinopril  Family History  Problem Relation Age of Onset  . Breast cancer Mother 20  . Breast cancer Sister 69    Social History Social History   Tobacco Use  . Smoking status: Never Smoker  . Smokeless tobacco: Never Used  Substance Use Topics  . Alcohol use: No    Review of Systems  Constitutional: Fever and some chills Eyes: No visual changes. ENT: No sore throat. Cardiovascular: Denies chest pain. Respiratory: Denies shortness of breath. Gastrointestinal: No abdominal pain.  See HPI, nausea is no vomiting Genitourinary: See HPI Musculoskeletal: Negative for back pain. Skin: Negative for rash. Neurological: Negative for headaches, areas of focal weakness or numbness.    ____________________________________________   PHYSICAL EXAM:  VITAL SIGNS: ED Triage Vitals  Enc Vitals Group     BP 06/17/20  1307 (!) 127/59     Pulse Rate 06/17/20 1307 94     Resp 06/17/20 1307 (!) 22     Temp 06/17/20 1307 (!) 102.9 F (39.4 C)     Temp Source 06/17/20 1307 Oral     SpO2 06/17/20 1307 96 %     Weight 06/17/20 1308 274 lb (124.3 kg)     Height 06/17/20 1308 5\' 5"  (1.651 m)     Head Circumference --      Peak Flow --      Pain Score 06/17/20 1308 9     Pain Loc --      Pain Edu? --      Excl. in [place]? --     Constitutional: Alert and oriented. Well appearing and in no acute distress.  Very  pleasant. Eyes: Conjunctivae are normal. Head: Atraumatic. Nose: No congestion/rhinnorhea. Mouth/Throat: Mucous membranes are moist. Neck: No stridor.  Cardiovascular: Normal rate, regular rhythm. Grossly normal heart sounds.  Good peripheral circulation. Respiratory: Just slightly tachypneic no retractions. Lungs CTAB.  Speaks in full clear sentences Gastrointestinal: Soft and nontender. No distention.  Endorses no abdominal tenderness to exam of the abdomen in its quadrants.  No CVA tenderness bilateral Musculoskeletal: No lower extremity tenderness nor edema. Neurologic:  Normal speech and language. No gross focal neurologic deficits are appreciated.  Skin:  Skin is warm, dry and intact. No rash noted. Psychiatric: Mood and affect are normal. Speech and behavior are normal.  ____________________________________________   LABS (all labs ordered are listed, but only abnormal results are displayed)  Labs Reviewed  URINALYSIS, COMPLETE (UACMP) WITH MICROSCOPIC - Abnormal; Notable for the following components:      Result Value   Color, Urine YELLOW (*)    APPearance CLOUDY (*)    Hgb urine dipstick MODERATE (*)    Leukocytes,Ua MODERATE (*)    Bacteria, UA RARE (*)    All other components within normal limits  COMPREHENSIVE METABOLIC PANEL - Abnormal; Notable for the following components:   Glucose, Bld 112 (*)    Creatinine, Ser 1.46 (*)    Total Protein 8.2 (*)    Alkaline Phosphatase 129 (*)    GFR, Estimated 38 (*)    All other components within normal limits  LACTIC ACID, PLASMA - Abnormal; Notable for the following components:   Lactic Acid, Venous 2.1 (*)    All other components within normal limits  CBC WITH DIFFERENTIAL/PLATELET - Abnormal; Notable for the following components:   Hemoglobin 15.1 (*)    HCT 46.3 (*)    Lymphs Abs 0.3 (*)    Monocytes Absolute 0.0 (*)    All other components within normal limits  RESP PANEL BY RT-PCR (FLU A&B, COVID) [id number]   CULTURE, BLOOD (ROUTINE X 2)  CULTURE, BLOOD (ROUTINE X 2)  URINE CULTURE  PROTIME-INR  LIPASE, BLOOD  LACTIC ACID, PLASMA   ____________________________________________  EKG Orders placed or performed in visit on 06/17/20  . EKG 12-Lead  Sinus tachycardia Prolonged PR interval Right axis deviation Low voltage, precordial leads No noted acute ischemia ____________________________________________  RADIOLOGY  DG Chest 2 View  Result Date: 06/17/2020 CLINICAL DATA:  Abdominal pain and urinary frequency. EXAM: CHEST - 2 VIEW COMPARISON:  01/28/2007 FINDINGS: The heart is enlarged. Mild tortuosity of the thoracic aorta. The pulmonary hila appear unremarkable. No acute pulmonary findings. No infiltrates or effusions. No worrisome pulmonary lesions. The bony thorax is intact. IMPRESSION: Mild cardiac enlargement but no acute pulmonary findings. Electronically Signed  By: Marijo Sanes M.D.   On: 06/17/2020 14:37    Chest x-ray reviewed negative for acute finding. ____________________________________________   PROCEDURES  Procedure(s) performed: None  Procedures  Critical Care performed: No  ____________________________________________   INITIAL IMPRESSION / ASSESSMENT AND PLAN / ED COURSE  Pertinent labs & imaging results that were available during my care of the patient were reviewed by me and considered in my medical decision making (see chart for details).   Patient was for evaluation of dysuria and urinary frequency.  Does report some discomfort in the suprapubic region.  Associated with fever and chills nausea and vomiting.  Very reassuring abdominal exam without evidence of peritonitis or acute abdomen no CVA tenderness bilateral.  She definitely endorses symptoms of dysuria though and symptoms consistent with probable urinary tract infection.  Urinalysis also consistent with same  We will treat for UTI, she does have evidence supporting potential mild sepsis at this  time without evidence of endorgan dysfunction.  Will hydrate, provide antibiotic, and discussed with the patient including observation overnight for antibiotics and to evaluate response to treatment, but after discussing with the patient her goal is to be able to go home and she really proved would prefer not to have to be admitted at this point in time.  I think given the prevalence of Covid, etc. and her concerns it is reasonable to hydrate her, provide antibiotic therapy, and if she feels improved able to take by mouth and vital signs improved after treatment here in the ER she could be discharged home with antibiotic and antiemetic.  Patient understand agreeable this plan, but also if she is not improving she could potentially be admitted though her strong preference at this time would be to go home.  Ongoing care assigned to Dr. Cheri Fowler      ____________________________________________   FINAL CLINICAL IMPRESSION(S) / ED DIAGNOSES  Final diagnoses:  Lower urinary tract infection, acute  Sepsis without acute organ dysfunction, due to unspecified organism [facility])        Note:  This document was prepared using Dragon voice recognition software and may include unintentional dictation errors       Delman Kitten, MD 06/17/20 1558

## 2020-06-17 NOTE — ED Triage Notes (Signed)
Please review first nurse note. Pt reports lower abd pain starting this morning, and increase in urinary frequency. Denies pain with urination, or blood in urine. NAD noted at this time.

## 2020-06-17 NOTE — ED Triage Notes (Signed)
Pt to ED via [facility] with c/o possible UTI, per EMS pt with urinary frequency this morning but reports difficulty producing urine. Per EMS pt also c/o lower abd pain. Per EMS pt also drank cranberry juice from 2018 today.     100.4 oral 99HR 95% RA 124/72 CBG 144

## 2020-06-17 NOTE — ED Notes (Signed)
Date and time results received: 06/17/20   Test: lactic acid  Critical Value: 2.1  Name of Provider Notified: Jacqualine Code

## 2020-06-17 NOTE — ED Notes (Signed)
Patient provided with ginger ale and graham crackers for PO challenge.

## 2020-06-18 ENCOUNTER — Encounter
Admission: EM | Disposition: A | Discharge status: Home Health | Payer: Self-pay | Source: Home / Self Care | Attending: Internal Medicine

## 2020-06-18 ENCOUNTER — Emergency Department: Payer: Medicare Other

## 2020-06-18 ENCOUNTER — Inpatient Hospital Stay: Payer: Medicare Other | Admitting: Certified Registered"

## 2020-06-18 ENCOUNTER — Telehealth: Payer: Self-pay | Admitting: Emergency Medicine

## 2020-06-18 ENCOUNTER — Other Ambulatory Visit: Payer: Self-pay

## 2020-06-18 ENCOUNTER — Encounter: Payer: Self-pay | Admitting: Emergency Medicine

## 2020-06-18 ENCOUNTER — Inpatient Hospital Stay
Admission: EM | Admit: 2020-06-18 | Discharge: 2020-06-22 | DRG: 853 | Disposition: A | Discharge status: Home Health | Payer: Medicare Other | Attending: Internal Medicine | Admitting: Internal Medicine

## 2020-06-18 DIAGNOSIS — Z6841 Body Mass Index (BMI) 40.0 and over, adult: Secondary | ICD-10-CM | POA: Diagnosis not present

## 2020-06-18 DIAGNOSIS — N10 Acute tubulo-interstitial nephritis: Secondary | ICD-10-CM | POA: Diagnosis present

## 2020-06-18 DIAGNOSIS — N202 Calculus of kidney with calculus of ureter: Secondary | ICD-10-CM | POA: Diagnosis present

## 2020-06-18 DIAGNOSIS — A419 Sepsis, unspecified organism: Secondary | ICD-10-CM | POA: Diagnosis present

## 2020-06-18 DIAGNOSIS — R6521 Severe sepsis with septic shock: Secondary | ICD-10-CM | POA: Diagnosis present

## 2020-06-18 DIAGNOSIS — Z79899 Other long term (current) drug therapy: Secondary | ICD-10-CM

## 2020-06-18 DIAGNOSIS — Z9842 Cataract extraction status, left eye: Secondary | ICD-10-CM | POA: Diagnosis not present

## 2020-06-18 DIAGNOSIS — A4151 Sepsis due to Escherichia coli [E. coli]: Principal | ICD-10-CM | POA: Diagnosis present

## 2020-06-18 DIAGNOSIS — Z888 Allergy status to other drugs, medicaments and biological substances status: Secondary | ICD-10-CM

## 2020-06-18 DIAGNOSIS — R011 Cardiac murmur, unspecified: Secondary | ICD-10-CM | POA: Diagnosis present

## 2020-06-18 DIAGNOSIS — N138 Other obstructive and reflux uropathy: Secondary | ICD-10-CM | POA: Diagnosis present

## 2020-06-18 DIAGNOSIS — N12 Tubulo-interstitial nephritis, not specified as acute or chronic: Secondary | ICD-10-CM

## 2020-06-18 DIAGNOSIS — N136 Pyonephrosis: Secondary | ICD-10-CM | POA: Diagnosis present

## 2020-06-18 DIAGNOSIS — Z90710 Acquired absence of both cervix and uterus: Secondary | ICD-10-CM | POA: Diagnosis not present

## 2020-06-18 DIAGNOSIS — Z9841 Cataract extraction status, right eye: Secondary | ICD-10-CM | POA: Diagnosis not present

## 2020-06-18 DIAGNOSIS — N132 Hydronephrosis with renal and ureteral calculous obstruction: Secondary | ICD-10-CM | POA: Diagnosis not present

## 2020-06-18 DIAGNOSIS — Z961 Presence of intraocular lens: Secondary | ICD-10-CM | POA: Diagnosis present

## 2020-06-18 DIAGNOSIS — G43909 Migraine, unspecified, not intractable, without status migrainosus: Secondary | ICD-10-CM | POA: Diagnosis present

## 2020-06-18 DIAGNOSIS — R7881 Bacteremia: Secondary | ICD-10-CM | POA: Diagnosis present

## 2020-06-18 DIAGNOSIS — I10 Essential (primary) hypertension: Secondary | ICD-10-CM | POA: Diagnosis present

## 2020-06-18 DIAGNOSIS — Z20822 Contact with and (suspected) exposure to covid-19: Secondary | ICD-10-CM | POA: Diagnosis present

## 2020-06-18 DIAGNOSIS — D72829 Elevated white blood cell count, unspecified: Secondary | ICD-10-CM | POA: Diagnosis not present

## 2020-06-18 DIAGNOSIS — N179 Acute kidney failure, unspecified: Secondary | ICD-10-CM | POA: Diagnosis present

## 2020-06-18 DIAGNOSIS — N200 Calculus of kidney: Secondary | ICD-10-CM

## 2020-06-18 DIAGNOSIS — M109 Gout, unspecified: Secondary | ICD-10-CM | POA: Diagnosis present

## 2020-06-18 DIAGNOSIS — N201 Calculus of ureter: Secondary | ICD-10-CM | POA: Diagnosis not present

## 2020-06-18 HISTORY — PX: CYSTOSCOPY WITH STENT PLACEMENT: SHX5790

## 2020-06-18 LAB — BLOOD CULTURE ID PANEL (REFLEXED) - BCID2

## 2020-06-18 LAB — COMPREHENSIVE METABOLIC PANEL
ALT: 20 U/L (ref 0–44)
AST: 40 U/L (ref 15–41)
Albumin: 2.9 g/dL — ABNORMAL LOW (ref 3.5–5.0)
Alkaline Phosphatase: 79 U/L (ref 38–126)
Anion gap: 15 (ref 5–15)
BUN: 34 mg/dL — ABNORMAL HIGH (ref 8–23)
CO2: 16 mmol/L — ABNORMAL LOW (ref 22–32)
Calcium: 8.2 mg/dL — ABNORMAL LOW (ref 8.9–10.3)
Chloride: 107 mmol/L (ref 98–111)
Creatinine, Ser: 2.64 mg/dL — ABNORMAL HIGH (ref 0.44–1.00)
GFR, Estimated: 19 mL/min — ABNORMAL LOW (ref >60)
Glucose, Bld: 96 mg/dL (ref 70–99)
Potassium: 3.9 mmol/L (ref 3.5–5.1)
Sodium: 138 mmol/L (ref 135–145)
Total Bilirubin: 0.8 mg/dL (ref 0.3–1.2)
Total Protein: 6.5 g/dL (ref 6.5–8.1)

## 2020-06-18 LAB — CBC WITH DIFFERENTIAL/PLATELET
Abs Immature Granulocytes: 2.21 10*3/uL — ABNORMAL HIGH (ref 0.00–0.07)
Basophils Absolute: 0 10*3/uL (ref 0.0–0.1)
Basophils Relative: 0 %
Eosinophils Absolute: 0.1 10*3/uL (ref 0.0–0.5)
Eosinophils Relative: 0 %
HCT: 42.6 % (ref 36.0–46.0)
Hemoglobin: 14.1 g/dL (ref 12.0–15.0)
Immature Granulocytes: 7 %
Lymphocytes Relative: 4 %
Lymphs Abs: 1.3 10*3/uL (ref 0.7–4.0)
MCH: 31.9 pg (ref 26.0–34.0)
MCHC: 33.1 g/dL (ref 30.0–36.0)
MCV: 96.4 fL (ref 80.0–100.0)
Monocytes Absolute: 1.3 10*3/uL — ABNORMAL HIGH (ref 0.1–1.0)
Monocytes Relative: 4 %
Neutro Abs: 26.4 10*3/uL — ABNORMAL HIGH (ref 1.7–7.7)
Neutrophils Relative %: 85 %
Platelets: 126 10*3/uL — ABNORMAL LOW (ref 150–400)
RBC: 4.42 MIL/uL (ref 3.87–5.11)
RDW: 13 % (ref 11.5–15.5)
Smear Review: NORMAL
WBC: 31.2 10*3/uL — ABNORMAL HIGH (ref 4.0–10.5)
nRBC: 0 % (ref 0.0–0.2)

## 2020-06-18 LAB — LACTIC ACID, PLASMA
Lactic Acid, Venous: 2.5 mmol/L (ref 0.5–1.9)
Lactic Acid, Venous: 3.3 mmol/L (ref 0.5–1.9)
Lactic Acid, Venous: 2.7 mmol/L (ref 0.5–1.9)
Lactic Acid, Venous: 1.7 mmol/L (ref 0.5–1.9)

## 2020-06-18 LAB — RESP PANEL BY RT-PCR (FLU A&B, COVID) ARPGX2
Influenza A by PCR: NEGATIVE
Influenza B by PCR: NEGATIVE
SARS Coronavirus 2 by RT PCR: NEGATIVE

## 2020-06-18 LAB — CULTURE, BLOOD (ROUTINE X 2)

## 2020-06-18 LAB — APTT: aPTT: 41 seconds — ABNORMAL HIGH (ref 24–36)

## 2020-06-18 LAB — BRAIN NATRIURETIC PEPTIDE: B Natriuretic Peptide: 573.4 pg/mL — ABNORMAL HIGH (ref 0.0–100.0)

## 2020-06-18 LAB — GLUCOSE, CAPILLARY: Glucose-Capillary: 97 mg/dL (ref 70–99)

## 2020-06-18 LAB — PROCALCITONIN: Procalcitonin: >150 ng/mL

## 2020-06-18 LAB — HIV ANTIBODY (ROUTINE TESTING W REFLEX): HIV Screen 4th Generation wRfx: NONREACTIVE

## 2020-06-18 SURGERY — CYSTOSCOPY, WITH STENT INSERTION
Anesthesia: General | Site: Ureter | Laterality: Right

## 2020-06-18 MED ORDER — SODIUM CHLORIDE 0.9 % IV SOLN
2.0000 g | INTRAVENOUS | Status: DC
  Administered 2020-06-18 – 2020-06-20 (×3): 2 g via INTRAVENOUS
  Filled 2020-06-18: qty 2
  Filled 2020-06-18 (×3): qty 20

## 2020-06-18 MED ORDER — FENTANYL CITRATE (PF) 100 MCG/2ML IJ SOLN
INTRAMUSCULAR | Status: DC | PRN
  Administered 2020-06-18: 100 ug via INTRAVENOUS

## 2020-06-18 MED ORDER — ETOMIDATE 2 MG/ML IV SOLN
INTRAVENOUS | Status: AC
  Filled 2020-06-18: qty 10

## 2020-06-18 MED ORDER — SODIUM CHLORIDE 0.9 % IV SOLN: 250.0000 mL | INTRAVENOUS | Status: DC

## 2020-06-18 MED ORDER — CHLORHEXIDINE GLUCONATE CLOTH 2 % EX PADS
6.0000 | MEDICATED_PAD | Freq: Every day | CUTANEOUS | Status: DC
  Administered 2020-06-19 – 2020-06-20 (×2): 6 via TOPICAL

## 2020-06-18 MED ORDER — LIDOCAINE HCL (PF) 2 % IJ SOLN
INTRAMUSCULAR | Status: AC
  Filled 2020-06-18: qty 5

## 2020-06-18 MED ORDER — SODIUM CHLORIDE 0.9% FLUSH: 3.0000 mL | INTRAVENOUS | Status: DC | PRN

## 2020-06-18 MED ORDER — SODIUM CHLORIDE 0.9 % IV SOLN: 250.0000 mL | INTRAVENOUS | Status: DC | PRN

## 2020-06-18 MED ORDER — ONDANSETRON HCL 4 MG/2ML IJ SOLN: 4.0000 mg | Freq: Three times a day (TID) | INTRAMUSCULAR | Status: DC | PRN

## 2020-06-18 MED ORDER — ONDANSETRON HCL 4 MG/2ML IJ SOLN
INTRAMUSCULAR | Status: DC | PRN
  Administered 2020-06-18: 4 mg via INTRAVENOUS

## 2020-06-18 MED ORDER — LACTATED RINGERS IV BOLUS
1000.0000 mL | Freq: Once | INTRAVENOUS | Status: AC
  Administered 2020-06-18: 1000 mL via INTRAVENOUS

## 2020-06-18 MED ORDER — ONDANSETRON HCL 4 MG/2ML IJ SOLN
4.0000 mg | Freq: Once | INTRAMUSCULAR | Status: AC
  Administered 2020-06-18: 4 mg via INTRAVENOUS
  Filled 2020-06-18: qty 2

## 2020-06-18 MED ORDER — SODIUM CHLORIDE 0.9% FLUSH
3.0000 mL | Freq: Two times a day (BID) | INTRAVENOUS | Status: DC
  Administered 2020-06-18 – 2020-06-21 (×4): 3 mL via INTRAVENOUS

## 2020-06-18 MED ORDER — FAMOTIDINE IN NACL 20-0.9 MG/50ML-% IV SOLN
20.0000 mg | Freq: Two times a day (BID) | INTRAVENOUS | Status: DC
  Administered 2020-06-18 – 2020-06-20 (×4): 20 mg via INTRAVENOUS
  Filled 2020-06-18 (×5): qty 50

## 2020-06-18 MED ORDER — LACTATED RINGERS IV BOLUS: 1000.0000 mL | Freq: Once | INTRAVENOUS | Status: DC

## 2020-06-18 MED ORDER — ETOMIDATE 2 MG/ML IV SOLN
INTRAVENOUS | Status: DC | PRN
  Administered 2020-06-18: 10 mg via INTRAVENOUS

## 2020-06-18 MED ORDER — SODIUM CHLORIDE 0.9 % IV SOLN
1.0000 g | Freq: Two times a day (BID) | INTRAVENOUS | Status: DC
  Filled 2020-06-18 (×2): qty 1

## 2020-06-18 MED ORDER — SUCCINYLCHOLINE CHLORIDE 20 MG/ML IJ SOLN
INTRAMUSCULAR | Status: DC | PRN
  Administered 2020-06-18: 140 mg via INTRAVENOUS

## 2020-06-18 MED ORDER — PHENYLEPHRINE HCL (PRESSORS) 10 MG/ML IV SOLN
INTRAVENOUS | Status: DC | PRN
  Administered 2020-06-18: 6250 ug via INTRAVENOUS

## 2020-06-18 MED ORDER — LACTATED RINGERS IV SOLN: INTRAVENOUS | Status: DC

## 2020-06-18 MED ORDER — PIPERACILLIN-TAZOBACTAM 4.5 G IVPB
4.5000 g | Freq: Once | INTRAVENOUS | Status: AC
  Administered 2020-06-18: 4.5 g via INTRAVENOUS
  Filled 2020-06-18: qty 100

## 2020-06-18 MED ORDER — NOREPINEPHRINE 4 MG/250ML-% IV SOLN
2.0000 ug/min | INTRAVENOUS | Status: DC
  Administered 2020-06-18: 10 ug/min via INTRAVENOUS
  Administered 2020-06-18: 7 ug/min via INTRAVENOUS
  Filled 2020-06-18 (×2): qty 250

## 2020-06-18 MED ORDER — FENTANYL CITRATE (PF) 100 MCG/2ML IJ SOLN
INTRAMUSCULAR | Status: AC
  Filled 2020-06-18: qty 2

## 2020-06-18 MED ORDER — PROPOFOL 10 MG/ML IV BOLUS
INTRAVENOUS | Status: AC
  Filled 2020-06-18: qty 20

## 2020-06-18 MED ORDER — ACETAMINOPHEN 325 MG PO TABS
650.0000 mg | ORAL_TABLET | ORAL | Status: DC | PRN
  Administered 2020-06-19 – 2020-06-22 (×3): 650 mg via ORAL
  Filled 2020-06-18 (×2): qty 2

## 2020-06-18 MED ORDER — ONDANSETRON HCL 4 MG/2ML IJ SOLN
4.0000 mg | Freq: Four times a day (QID) | INTRAMUSCULAR | Status: DC | PRN
  Administered 2020-06-19 – 2020-06-20 (×2): 4 mg via INTRAVENOUS
  Filled 2020-06-18 (×2): qty 2

## 2020-06-18 MED ORDER — SODIUM CHLORIDE 0.9 % IV SOLN
INTRAVENOUS | Status: DC | PRN
  Administered 2020-06-18: 100 ug via INTRAVENOUS

## 2020-06-18 MED ORDER — POLYETHYLENE GLYCOL 3350 17 G PO PACK: 17.0000 g | PACK | Freq: Every day | ORAL | Status: DC | PRN

## 2020-06-18 MED ORDER — LIDOCAINE HCL (CARDIAC) PF 100 MG/5ML IV SOSY
PREFILLED_SYRINGE | INTRAVENOUS | Status: DC | PRN
  Administered 2020-06-18: 100 mg via INTRAVENOUS

## 2020-06-18 MED ORDER — MIDODRINE HCL 5 MG PO TABS
10.0000 mg | ORAL_TABLET | Freq: Three times a day (TID) | ORAL | Status: DC
  Administered 2020-06-18 – 2020-06-20 (×6): 10 mg via ORAL
  Filled 2020-06-18 (×6): qty 2

## 2020-06-18 MED ORDER — ACETAMINOPHEN 325 MG PO TABS
650.0000 mg | ORAL_TABLET | Freq: Four times a day (QID) | ORAL | Status: DC | PRN
  Administered 2020-06-19 (×2): 650 mg via ORAL
  Filled 2020-06-18 (×3): qty 2

## 2020-06-18 MED ORDER — DOCUSATE SODIUM 100 MG PO CAPS: 100.0000 mg | ORAL_CAPSULE | Freq: Two times a day (BID) | ORAL | Status: DC | PRN

## 2020-06-18 MED ORDER — NOREPINEPHRINE 4 MG/250ML-% IV SOLN
INTRAVENOUS | Status: AC
  Administered 2020-06-18: 2 ug/min via INTRAVENOUS
  Filled 2020-06-18: qty 250

## 2020-06-18 MED ORDER — HYDROCORTISONE NA SUCCINATE PF 100 MG IJ SOLR
50.0000 mg | Freq: Four times a day (QID) | INTRAMUSCULAR | Status: DC
  Administered 2020-06-18 – 2020-06-20 (×8): 50 mg via INTRAVENOUS
  Filled 2020-06-18: qty 1
  Filled 2020-06-18 (×2): qty 2
  Filled 2020-06-18 (×3): qty 1
  Filled 2020-06-18 (×2): qty 2
  Filled 2020-06-18: qty 1
  Filled 2020-06-18: qty 2

## 2020-06-18 MED ORDER — MORPHINE SULFATE (PF) 4 MG/ML IV SOLN
4.0000 mg | Freq: Once | INTRAVENOUS | Status: AC
  Administered 2020-06-18: 4 mg via INTRAVENOUS
  Filled 2020-06-18: qty 1

## 2020-06-18 MED ORDER — SUCCINYLCHOLINE CHLORIDE 200 MG/10ML IV SOSY
PREFILLED_SYRINGE | INTRAVENOUS | Status: AC
  Filled 2020-06-18: qty 10

## 2020-06-18 SURGICAL SUPPLY — 18 items
BAG DRAIN CYSTO-URO LG1000N (MISCELLANEOUS) ×2 IMPLANT
BAG URO DRAIN 4000ML (MISCELLANEOUS) ×2 IMPLANT
CATH FOL 2WAY LX 16X30 (CATHETERS) ×2 IMPLANT
CATH URETL 5X70 OPEN END (CATHETERS) ×2 IMPLANT
GLOVE SURG ENC MOIS LTX SZ8 (GLOVE) ×2 IMPLANT
GOWN STRL REUS W/ TWL LRG LVL4 (GOWN DISPOSABLE) ×1 IMPLANT
GOWN STRL REUS W/ TWL XL LVL3 (GOWN DISPOSABLE) ×1 IMPLANT
GOWN STRL REUS W/TWL LRG LVL4 (GOWN DISPOSABLE) ×1
GOWN STRL REUS W/TWL XL LVL3 (GOWN DISPOSABLE) ×1
GUIDEWIRE STR DUAL SENSOR (WIRE) ×4 IMPLANT
KIT TURNOVER CYSTO (KITS) ×2 IMPLANT
MANIFOLD NEPTUNE II (INSTRUMENTS) ×2 IMPLANT
PACK CYSTO AR (MISCELLANEOUS) ×2 IMPLANT
SET CYSTO W/LG BORE CLAMP LF (SET/KITS/TRAYS/PACK) ×2 IMPLANT
SOL .9 NS 3000ML IRR  AL (IV SOLUTION) ×1
SOL .9 NS 3000ML IRR UROMATIC (IV SOLUTION) ×1 IMPLANT
STENT URET 6FRX24 CONTOUR (STENTS) ×2 IMPLANT
WATER STERILE IRR 1000ML POUR (IV SOLUTION) ×2 IMPLANT

## 2020-06-18 NOTE — Consult Note (Signed)
CODE SEPSIS - PHARMACY COMMUNICATION  **Broad Spectrum Antibiotics should be administered within 1 hour of Sepsis diagnosis**  Time Code Sepsis Called/Page Received: 0914  Antibiotics Ordered: Piperacillin/Tazobactam 4.5gms  Time of 1st antibiotic administration: 0946  Additional action taken by pharmacy: No indicated at this time  If necessary, Name of Provider/Nurse Contacted: Message to [place], RN    Berta Minor ,PharmD Clinical Pharmacist  06/18/2020  10:18 AM

## 2020-06-18 NOTE — Anesthesia Postprocedure Evaluation (Signed)
Anesthesia Post Note  Patient: Heather Simmons  Procedure(s) Performed: CYSTOSCOPY WITH STENT PLACEMENT (Right Ureter)  Patient location during evaluation: PACU Anesthesia Type: General Level of consciousness: awake and alert Pain management: pain level controlled Vital Signs Assessment: post-procedure vital signs reviewed and stable Respiratory status: spontaneous breathing and respiratory function stable Cardiovascular status: stable Anesthetic complications: no   No complications documented.   Last Vitals:  Vitals:   06/18/20 1225 06/18/20 1330  BP: (!) 95/59 (!) 73/56  Pulse: 77 64  Resp: (!) 23 (!) 22  Temp:  37.1 C  SpO2: 94% 96%    Last Pain:  Vitals:   06/18/20 1330  TempSrc: Axillary  PainSc:                  Dexter Signor K

## 2020-06-18 NOTE — Telephone Encounter (Signed)
0640- Lab called charge RN this morning and stated that pts blood cultures came back positive.  4 out of 4 bottles positive for gram negative rods, Enterobacterales and E. Coli.    0720-Dr. Jessup informed and informed this RN that pt needs to be called back to be admitted.   0743- This RN called pt and informed her that she needed to come back to be ED and be admitted for IV antibiotics. Pt verbalized understanding and states that she will call her sister to bring her back to the ED.

## 2020-06-18 NOTE — H&P (Signed)
Name: Heather Simmons MRN: [id number] DOB: 09-20-1950     CONSULTATION DATE: 06/18/2020  REFERRING MD : Charna Archer  CHIEF COMPLAINT: acute septic shock  HISTORY OF PRESENT ILLNESS: 70 y.o. female with past medical history of hypertension, gout, and migraines who presents to the ED complaining of abnormal labs.    Patient was seen in the ED yesterday for 24 hours of generalized weakness, urinary frequency, and lower abdominal discomfort.    + diagnosed with a UTI at that time and started on cefdinir.   She states that she had a hard time sleeping last night and developed chills this morning  felt like she was feverish but has not taken her temperature.   Pain has also extended into her right flank.    She describes this pain as sharp and constant, not exacerbated or alleviated by anything.    She felt nauseous overnight, but has not vomited.  She received a call this morning that her blood cultures were growing gram-negative rods and that she needed to come back to the hospital.  ER source patient with severe hypotension started ABX Placed on pressors UROLOGY consulted Procedure: Cystoscopy with insertion of right double-J stent. Preop diagnosis: 4 mm right distal ureteral stone with obstruction and sepsis. 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    PAST MEDICAL HISTORY :   has a past medical history of Anginal pain ([place]), Complication of anesthesia, Fluid collection (edema) in the arms, legs, hands and feet, Gout, Headache, Heart murmur, and Palpitations.  has a past surgical history that includes Abdominal hysterectomy; Cataract extraction w/PHACO (Right, 10/04/2015); Cataract extraction w/PHACO (Left, 11/01/2015); and Oophorectomy. Prior to Admission medications   Medication Sig Start Date End Date Taking? Authorizing Provider  allopurinol (ZYLOPRIM) 100 MG tablet Take 100 mg by mouth daily.   Yes [provider]  amLODipine  (NORVASC) 10 MG tablet Take 10 mg by mouth at bedtime. HS   Yes [provider]  carvedilol (COREG) 6.25 MG tablet Take 6.25 mg by mouth 2 (two) times daily with a meal.   Yes [provider]  cefdinir (OMNICEF) 300 MG capsule Take 1 capsule (300 mg total) by mouth 2 (two) times daily for 5 days. 06/17/20 06/22/20 Yes Naaman Plummer, MD  losartan (COZAAR) 100 MG tablet Take 100 mg by mouth at bedtime.    Yes [provider]  ondansetron (ZOFRAN ODT) 4 MG disintegrating tablet Take 1 tablet (4 mg total) by mouth every 8 (eight) hours as needed for nausea or vomiting. 06/17/20  Yes Bradler, Vista Lawman, MD  topiramate (TOPAMAX) 25 MG tablet Take 25 mg by mouth at bedtime. HS   Yes [provider]   Allergies  Allergen Reactions  . Lisinopril Other (See Comments)    Intolerance - HAs    FAMILY HISTORY:  family history includes Breast cancer (age of onset: 68) in her mother and sister. SOCIAL HISTORY:  reports that she has never smoked. She has never used smokeless tobacco. She reports that she does not drink alcohol.    Review of Systems:  Gen:  Denies  fever, sweats, chills weigh loss  HEENT: Denies blurred vision, double vision, ear pain, eye pain, hearing loss, nose bleeds, sore throat Cardiac:  No dizziness, chest pain or heaviness, chest tightness,edema, No JVD Resp:   No cough, -sputum production, -shortness of breath,-wheezing, -hemoptysis,  Gi: Denies swallowing difficulty, stomach pain, nausea or vomiting, diarrhea, constipation, bowel incontinence Gu:  Denies bladder  incontinence, burning urine Ext:   Denies Joint pain, stiffness or swelling Skin: Denies  skin rash, easy bruising or bleeding or hives Endoc:  Denies polyuria, polydipsia , polyphagia or weight change Psych:   Denies depression, insomnia or hallucinations  Other:  All other systems negative     VITAL SIGNS: Temp:  [98.7 F (37.1 C)-99.1 F (37.3 C)] 98.7 F (37.1 C) (02/12  1330) Pulse Rate:  [64-119] 64 (02/12 1330) Resp:  [18-32] 22 (02/12 1330) BP: (64-112)/(43-79) 73/56 (02/12 1330) SpO2:  [86 %-99 %] 96 % (02/12 1330) Weight:  [125 kg-127.8 kg] 127.8 kg (02/12 1330)     SpO2: 96 % O2 Flow Rate (L/min): 2 L/min    Physical Examination:   GENERAL:+ fatigue HEAD: Normocephalic, atraumatic.  EYES: PERLA, EOMI No scleral icterus.  MOUTH: Moist mucosal membrane.  EAR, NOSE, THROAT: Clear without exudates. No external lesions.  NECK: Supple.  PULMONARY: CTA B/L no wheezing, rhonchi, crackles CARDIOVASCULAR: S1 and S2. Regular rate and rhythm. No murmurs GASTROINTESTINAL: Soft, nontender, nondistended. Positive bowel sounds.  MUSCULOSKELETAL: No swelling, clubbing, or edema.  NEUROLOGIC: No gross focal neurological deficits. 5/5 strength all extremities SKIN: No ulceration, lesions, rashes, or cyanosis.  PSYCHIATRIC: Insight, judgment intact. -depression -anxiety ALL OTHER ROS ARE NEGATIVE   MEDICATIONS: I have reviewed all medications and confirmed regimen as documented    CULTURE RESULTS   Recent Results (from the past 240 hour(s))  Culture, blood (Routine x 2)     Status: None (Preliminary result)   Collection Time: 06/17/20  1:38 PM   Specimen: BLOOD  Result Value Ref Range Status   Specimen Description BLOOD RIGHT ANTECUBITAL  Final   Special Requests   Final    BOTTLES DRAWN AEROBIC AND ANAEROBIC Blood Culture adequate volume   Culture  Setup Time   Final    GRAM NEGATIVE RODS IN BOTH AEROBIC AND ANAEROBIC BOTTLES CRITICAL RESULT CALLED TO, READ BACK BY AND VERIFIED WITH: ANGELA ROBBINS AT [place] 06/18/20.PMF Performed at [facility], [address] [place], [place] [address]    Culture GRAM NEGATIVE RODS  Final   Report Status PENDING  Incomplete  Culture, blood (Routine x 2)     Status: None (Preliminary result)   Collection Time: 06/17/20  1:42 PM   Specimen: BLOOD  Result Value Ref Range Status   Specimen  Description BLOOD BLOOD LEFT WRIST  Final   Special Requests   Final    BOTTLES DRAWN AEROBIC AND ANAEROBIC Blood Culture results may not be optimal due to an inadequate volume of blood received in culture bottles   Culture  Setup Time   Final    Organism ID to follow GRAM NEGATIVE RODS IN BOTH AEROBIC AND ANAEROBIC BOTTLES CRITICAL RESULT CALLED TO, READ BACK BY AND VERIFIED WITH: ANGELA ROBBINS AT [place] 06/18/20.PMF Performed at [facility], [address], [place] [address]    Culture GRAM NEGATIVE RODS  Final   Report Status PENDING  Incomplete  Blood Culture ID Panel (Reflexed)     Status: Abnormal   Collection Time: 06/17/20  1:42 PM  Result Value Ref Range Status   Enterococcus faecalis NOT DETECTED NOT DETECTED Final   Enterococcus Faecium NOT DETECTED NOT DETECTED Final   Listeria monocytogenes NOT DETECTED NOT DETECTED Final   Staphylococcus species NOT DETECTED NOT DETECTED Final   Staphylococcus aureus (BCID) NOT DETECTED NOT DETECTED Final   Staphylococcus epidermidis NOT DETECTED NOT DETECTED Final   Staphylococcus lugdunensis NOT DETECTED  NOT DETECTED Final   Streptococcus species NOT DETECTED NOT DETECTED Final   Streptococcus agalactiae NOT DETECTED NOT DETECTED Final   Streptococcus pneumoniae NOT DETECTED NOT DETECTED Final   Streptococcus pyogenes NOT DETECTED NOT DETECTED Final   A.calcoaceticus-baumannii NOT DETECTED NOT DETECTED Final   Bacteroides fragilis NOT DETECTED NOT DETECTED Final   Enterobacterales DETECTED (A) NOT DETECTED Final    Comment: Enterobacterales represent a large order of gram negative bacteria, not a single organism. CRITICAL RESULT CALLED TO, READ BACK BY AND VERIFIED WITH: ANGELA ROBBINS AT 2297 06/18/20.PMF    Enterobacter cloacae complex NOT DETECTED NOT DETECTED Final   Escherichia coli DETECTED (A) NOT DETECTED Final    Comment: CRITICAL RESULT CALLED TO, READ BACK BY AND VERIFIED WITH: ANGELA ROBBINS AT 9892  06/18/20.PMF    Klebsiella aerogenes NOT DETECTED NOT DETECTED Final   Klebsiella oxytoca NOT DETECTED NOT DETECTED Final   Klebsiella pneumoniae NOT DETECTED NOT DETECTED Final   Proteus species NOT DETECTED NOT DETECTED Final   Salmonella species NOT DETECTED NOT DETECTED Final   Serratia marcescens NOT DETECTED NOT DETECTED Final   Haemophilus influenzae NOT DETECTED NOT DETECTED Final   Neisseria meningitidis NOT DETECTED NOT DETECTED Final   Pseudomonas aeruginosa NOT DETECTED NOT DETECTED Final   Stenotrophomonas maltophilia NOT DETECTED NOT DETECTED Final   Candida albicans NOT DETECTED NOT DETECTED Final   Candida auris NOT DETECTED NOT DETECTED Final   Candida glabrata NOT DETECTED NOT DETECTED Final   Candida krusei NOT DETECTED NOT DETECTED Final   Candida parapsilosis NOT DETECTED NOT DETECTED Final   Candida tropicalis NOT DETECTED NOT DETECTED Final   Cryptococcus neoformans/gattii NOT DETECTED NOT DETECTED Final   CTX-M ESBL NOT DETECTED NOT DETECTED Final   Carbapenem resistance IMP NOT DETECTED NOT DETECTED Final   Carbapenem resistance KPC NOT DETECTED NOT DETECTED Final   Carbapenem resistance NDM NOT DETECTED NOT DETECTED Final   Carbapenem resist OXA 48 LIKE NOT DETECTED NOT DETECTED Final   Carbapenem resistance VIM NOT DETECTED NOT DETECTED Final    Comment: Performed at [facility], [address] [place], [place] [address]  Resp Panel by RT-PCR (Flu A&B, Covid) Nasopharyngeal Swab     Status: None   Collection Time: 06/17/20  2:54 PM   Specimen: Nasopharyngeal Swab; Nasopharyngeal(NP) swabs in vial transport medium  Result Value Ref Range Status   SARS Coronavirus 2 by RT PCR NEGATIVE NEGATIVE Final    Comment: (NOTE) SARS-CoV-2 target nucleic acids are NOT DETECTED.  The SARS-CoV-2 RNA is generally detectable in upper respiratory specimens during the acute phase of infection. The lowest concentration of SARS-CoV-2 viral copies this assay can  detect is 138 copies/mL. A negative result does not preclude SARS-Cov-2 infection and should not be used as the sole basis for treatment or other patient management decisions. A negative result may occur with  improper specimen collection/handling, submission of specimen other than nasopharyngeal swab, presence of viral mutation(s) within the areas targeted by this assay, and inadequate number of viral copies(<138 copies/mL). A negative result must be combined with clinical observations, patient history, and epidemiological information. The expected result is Negative.  Fact Sheet for Patients:  EntrepreneurPulse.com.au  Fact Sheet for Healthcare Providers:  IncredibleEmployment.be  This test is no t yet approved or cleared by the [place] FDA and  has been authorized for detection and/or diagnosis of SARS-CoV-2 by FDA under an Emergency Use Authorization (EUA). This EUA will remain  in effect (meaning this test  can be used) for the duration of the COVID-19 declaration under Section 564(b)(1) of the Act, 21 U.S.C.section 360bbb-3(b)(1), unless the authorization is terminated  or revoked sooner.       Influenza A by PCR NEGATIVE NEGATIVE Final   Influenza B by PCR NEGATIVE NEGATIVE Final    Comment: (NOTE) The Xpert Xpress SARS-CoV-2/FLU/RSV plus assay is intended as an aid in the diagnosis of influenza from Nasopharyngeal swab specimens and should not be used as a sole basis for treatment. Nasal washings and aspirates are unacceptable for Xpert Xpress SARS-CoV-2/FLU/RSV testing.  Fact Sheet for Patients: EntrepreneurPulse.com.au  Fact Sheet for Healthcare Providers: IncredibleEmployment.be  This test is not yet approved or cleared by the [place] FDA and has been authorized for detection and/or diagnosis of SARS-CoV-2 by FDA under an Emergency Use Authorization (EUA). This EUA will remain in  effect (meaning this test can be used) for the duration of the COVID-19 declaration under Section 564(b)(1) of the Act, 21 U.S.C. section 360bbb-3(b)(1), unless the authorization is terminated or revoked.  Performed at [facility], [address] [place], [place] [address]   Resp Panel by RT-PCR (Flu A&B, Covid) Nasopharyngeal Swab     Status: None   Collection Time: 06/18/20  9:55 AM   Specimen: Nasopharyngeal Swab; Nasopharyngeal(NP) swabs in vial transport medium  Result Value Ref Range Status   SARS Coronavirus 2 by RT PCR NEGATIVE NEGATIVE Final    Comment: (NOTE) SARS-CoV-2 target nucleic acids are NOT DETECTED.  The SARS-CoV-2 RNA is generally detectable in upper respiratory specimens during the acute phase of infection. The lowest concentration of SARS-CoV-2 viral copies this assay can detect is 138 copies/mL. A negative result does not preclude SARS-Cov-2 infection and should not be used as the sole basis for treatment or other patient management decisions. A negative result may occur with  improper specimen collection/handling, submission of specimen other than nasopharyngeal swab, presence of viral mutation(s) within the areas targeted by this assay, and inadequate number of viral copies(<138 copies/mL). A negative result must be combined with clinical observations, patient history, and epidemiological information. The expected result is Negative.  Fact Sheet for Patients:  EntrepreneurPulse.com.au  Fact Sheet for Healthcare Providers:  IncredibleEmployment.be  This test is no t yet approved or cleared by the [place] FDA and  has been authorized for detection and/or diagnosis of SARS-CoV-2 by FDA under an Emergency Use Authorization (EUA). This EUA will remain  in effect (meaning this test can be used) for the duration of the COVID-19 declaration under Section 564(b)(1) of the Act, 21 U.S.C.section 360bbb-3(b)(1),  unless the authorization is terminated  or revoked sooner.       Influenza A by PCR NEGATIVE NEGATIVE Final   Influenza B by PCR NEGATIVE NEGATIVE Final    Comment: (NOTE) The Xpert Xpress SARS-CoV-2/FLU/RSV plus assay is intended as an aid in the diagnosis of influenza from Nasopharyngeal swab specimens and should not be used as a sole basis for treatment. Nasal washings and aspirates are unacceptable for Xpert Xpress SARS-CoV-2/FLU/RSV testing.  Fact Sheet for Patients: EntrepreneurPulse.com.au  Fact Sheet for Healthcare Providers: IncredibleEmployment.be  This test is not yet approved or cleared by the [place] FDA and has been authorized for detection and/or diagnosis of SARS-CoV-2 by FDA under an Emergency Use Authorization (EUA). This EUA will remain in effect (meaning this test can be used) for the duration of the COVID-19 declaration under Section 564(b)(1) of the Act, 21 U.S.C. section 360bbb-3(b)(1), unless the authorization  is terminated or revoked.  Performed at [facility], [address] [place], [place] [address]           IMAGING    DG Chest 2 View  Result Date: 06/17/2020 CLINICAL DATA:  Abdominal pain and urinary frequency. EXAM: CHEST - 2 VIEW COMPARISON:  01/28/2007 FINDINGS: The heart is enlarged. Mild tortuosity of the thoracic aorta. The pulmonary hila appear unremarkable. No acute pulmonary findings. No infiltrates or effusions. No worrisome pulmonary lesions. The bony thorax is intact. IMPRESSION: Mild cardiac enlargement but no acute pulmonary findings. Electronically Signed   By: Marijo Sanes M.D.   On: 06/17/2020 14:37   DG OR UROLOGY CYSTO IMAGE ([facility] ONLY)  Result Date: 06/18/2020 There is no interpretation for this exam.  This order is for images obtained during a surgical procedure.  Please See "Surgeries" Tab for more information regarding the procedure.   CT Renal Stone  Study  Result Date: 06/18/2020 CLINICAL DATA:  Abdominal pain with nausea and vomiting EXAM: CT ABDOMEN AND PELVIS WITHOUT CONTRAST TECHNIQUE: Multidetector CT imaging of the abdomen and pelvis was performed following the standard protocol without oral or IV contrast. COMPARISON:  None. FINDINGS: Lower chest: Lung bases are clear. Hepatobiliary: There is hepatic steatosis. No focal liver lesions are evident on this noncontrast enhanced study. There are laminated gallstones within the gallbladder. The gallbladder wall does not appear thickened. There is no appreciable biliary duct dilatation. Pancreas: There is no appreciable pancreatic mass or inflammatory focus. Fatty infiltration is noted in portions of the head of the pancreas. Spleen: No splenic lesions are evident. Adrenals/Urinary Tract: Adrenals bilaterally appear normal. The right kidney is edematous. There is a suspected parapelvic cyst on the right measuring 3.5 x 2.7 cm. There is also mild hydronephrosis on the right. There is a calculus in the lower pole of the right kidney in the lateral pelvic region measuring 1.1 x 0.9 cm. No other renal calculi are evident. There is subtle edema around the proximal right ureter. No ureteral calculus is evident. There is perinephric soft tissue thickening on the right with thickening of the mesentery in the right mid abdomen extending inferiorly as well as medial and lateral to the right kidney. No well-defined abscess seen in this area. There is a calculus at the right ureterovesical junction measuring 4 x 3 mm. There is thickening of the wall of the urinary bladder. Stomach/Bowel: There is no appreciable bowel wall or mesenteric thickening. Loops of small bowel extend into a pelvic midline ventral hernia without demonstrable bowel compromise. There is no appreciable bowel obstruction. Terminal ileum appears normal. There is no evident free air or portal venous air. Vascular/Lymphatic: There are foci of aortic  atherosclerosis. No adenopathy is evident in the abdomen or pelvis. Reproductive: Uterus is absent.  No adnexal mass lesions evident. Other: No periappendiceal region inflammation. No ascites or abscess in the abdomen pelvis. As stated above, there is a midline pelvic ventral hernia containing loops of small bowel. This hernia at its neck measures 3.8 cm from right to left dimension and 2.7 cm from superior to inferior dimension. There is a hernia along the left lower quadrant abdominal wall which contains fat but no bowel. This hernia at its neck measures 5.1 cm from right to left dimension and 3.1 cm from superior to inferior dimension. There is a smaller midline ventral hernia near the umbilicus which contains fat but no bowel. This hernia measures 1.5 cm from right to left dimension at its neck  and 2.6 cm from superior to inferior dimension at its neck. There are areas of scarring in the anterior lower abdominal and pelvic wall regions. Musculoskeletal: There is degenerative change in the lower thoracic and lumbar regions. 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 There is spinal stenosis at L2-3, L3-4, and L4-5 due to disc protrusion and bony hypertrophy. No blastic or lytic bone lesions. No intramuscular lesions are evident. IMPRESSION: 1. Mild hydronephrosis on the right. There is a 4 x 3 mm calculus at the right ureterovesical junction. The right kidney is edematous with soft tissue stranding and thickening throughout perinephric fascia on the right. Focal right renal abscess. The changes in the mesentery on the right may be due to the ureteral calculus but also could be indicative of a degree of pyelonephritis on the right. There is also a calculus in the lower pole right kidney measuring 1.1 x 0.9 cm. 2. Thickening of the urinary bladder wall, a finding likely indicative of a degree of cystitis. 3.  Cholelithiasis.  No gallbladder wall thickening. 4. Ventral hernias as noted. There are loops of small bowel in a midline  ventral pelvic hernia without bowel compromise. Other hernias containing fat but no bowel. 5. Multilevel spinal stenosis due to disc protrusion and bony hypertrophy. 6.  Aortic Atherosclerosis (ICD10-I70.0). 7.  Hepatic steatosis. 8.  Uterus absent. Electronically Signed   By: Lowella Grip III M.D.   On: 06/18/2020 09:47       ASSESSMENT AND PLAN SYNOPSIS  70 yo morbidly obese white female with acute septic shock due to e coli bacteremia with infected kidney stone s/p ureteral stent complicated by acute rena lfaiiure  Septic shock -use vasopressors to keep MAP>65 -consider stress dose steroids -aggressive IV fluid Resuscitation -Start midodrine -Continue IV abx  ELECTROLYTES -follow labs as needed -replace as needed -pharmacy consultation and following  ACUTE KIDNEY INJURY/Renal Failure -continue Foley Catheter-assess need -Avoid nephrotoxic agents -Follow urine output, BMP -Ensure adequate renal perfusion, optimize oxygenation -Renal dose medications'   DVT/GI PRX ordered and assessed TRANSFUSIONS AS NEEDED MONITOR FSBS I Assessed the need for Labs I Assessed the need for Foley I Assessed the need for Central Venous Line Family Discussion when available I Assessed the need for Mobilization I made an Assessment of medications to be adjusted accordingly Safety Risk assessment completed  CASE DISCUSSED IN MULTIDISCIPLINARY ROUNDS WITH ICU TEAM     Patient/Family are satisfied with Plan of action and management. All questions answered  Corrin Parker, M.D.  [facility]  Medical Director [place] Director [facility] Cardio-Pulmonary Department

## 2020-06-18 NOTE — Discharge Instructions (Signed)
Ureteral Stent Implantation, Care After This sheet gives you information about how to care for yourself after your procedure. Your health care provider may also give you more specific instructions. If you have problems or questions, contact your health care provider. What can I expect after the procedure? After the procedure, it is common to have:  Nausea.  Mild pain when you urinate. You may feel this pain in your lower back or lower abdomen. The pain should stop within a few minutes after you urinate. This may last for up to 1 week.  A small amount of blood in your urine for several days. Follow these instructions at home: Medicines  Take over-the-counter and prescription medicines only as told by your health care provider.  If you were prescribed an antibiotic medicine, take it as told by your health care provider. Do not stop taking the antibiotic even if you start to feel better.  Do not drive for 24 hours if you were given a sedative during your procedure.  Ask your health care provider if the medicine prescribed to you requires you to avoid driving or using heavy machinery. Activity  Rest as told by your health care provider.  Avoid sitting for a long time without moving. Get up to take short walks every 1-2 hours. This is important to improve blood flow and breathing. Ask for help if you feel weak or unsteady.  Return to your normal activities as told by your health care provider. Ask your health care provider what activities are safe for you. General instructions  Watch for any blood in your urine. Call your health care provider if the amount of blood in your urine increases.  If you have a catheter: ? Follow instructions from your health care provider about taking care of your catheter and collection bag. ? Do not take baths, swim, or use a hot tub until your health care provider approves. Ask your health care provider if you may take showers. You may only be allowed to  take sponge baths.  Drink enough fluid to keep your urine pale yellow.  Do not use any products that contain nicotine or tobacco, such as cigarettes, e-cigarettes, and chewing tobacco. These can delay healing after surgery. If you need help quitting, ask your health care provider.  Keep all follow-up visits as told by your health care provider. This is important.   Contact a health care provider if:  You have pain that gets worse or does not get better with medicine, especially pain when you urinate.  You have difficulty urinating.  You feel nauseous or you vomit repeatedly during a period of more than 2 days after the procedure. Get help right away if:  Your urine is dark red or has blood clots in it.  You are leaking urine (have incontinence).  The end of the stent comes out of your urethra.  You cannot urinate.  You have sudden, sharp, or severe pain in your abdomen or lower back.  You have a fever.  You have swelling or pain in your legs.  You have difficulty breathing. Summary  After the procedure, it is common to have mild pain when you urinate that goes away within a few minutes after you urinate. This may last for up to 1 week.  Watch for any blood in your urine. Call your health care provider if the amount of blood in your urine increases.  Take over-the-counter and prescription medicines only as told by your health care provider.    Drink enough fluid to keep your urine pale yellow. This information is not intended to replace advice given to you by your health care provider. Make sure you discuss any questions you have with your health care provider. Document Revised: 01/28/2018 Document Reviewed: 01/29/2018 Elsevier Patient Education  2021 [employer]  

## 2020-06-18 NOTE — Consult Note (Signed)
Subjective: CC: Right flank pain with fever.    I was asked to see Heather Simmons in consultation by Dr. Jessup for the finding of a 4mm RUVJ stone with obstruction and sepsis.  She had the onset yesterday of fever and chills with urgency and frequency and was seen in the ER which she was treated for a UTI and sent home but returned today with progressive symptoms and was found to be septic. A CT demonstrated a 4mm RUVJ stone with obstruction, a focal right renal abscess and a 1.1cm RLP stone.  She has some right flank pain.  She has no prior history of stones or UTI's.  She has had no GU surgery.   She is hypotensive in the ER with a WBC of 31K and AKI with a Cr of 2.64 which is up from 1.46 on 2/11.   ROS:  Review of Systems  Constitutional: Positive for chills and fever.  Respiratory: Positive for shortness of breath.   Cardiovascular: Negative for chest pain.  Gastrointestinal: Positive for abdominal pain.  Genitourinary: Positive for flank pain, frequency and urgency.  All other systems reviewed and are negative.   Allergies  Allergen Reactions  . Lisinopril Other (See Comments)    Intolerance - HAs    Past Medical History:  Diagnosis Date  . Anginal pain (HCC)   . Complication of anesthesia    Nausea postoperatively with cataract surgery  . Fluid collection (edema) in the arms, legs, hands and feet   . Gout   . Headache    MIGRAINES  . Heart murmur   . Palpitations     Past Surgical History:  Procedure Laterality Date  . ABDOMINAL HYSTERECTOMY    . CATARACT EXTRACTION W/PHACO Right 10/04/2015   Procedure: CATARACT EXTRACTION PHACO AND INTRAOCULAR LENS PLACEMENT (IOC);  Surgeon: William Porfilio, MD;  Location: [facility] ORS;  Service: Ophthalmology;  Laterality: Right;  US 36.0 AP% 21.9 CDE 7.88 fluid pack lot # [id number]  . CATARACT EXTRACTION W/PHACO Left 11/01/2015   Procedure: CATARACT EXTRACTION PHACO AND INTRAOCULAR LENS PLACEMENT (IOC);  Surgeon: William Porfilio, MD;   Location: [facility] ORS;  Service: Ophthalmology;  Laterality: Left;  US 00:33 AP% 23.1 CDE 7.76 fluid pack lot # [id number]  . OOPHORECTOMY      Social History   Socioeconomic History  . Marital status: Single    Spouse name: Not on file  . Number of children: Not on file  . Years of education: Not on file  . Highest education level: Not on file  Occupational History  . Not on file  Tobacco Use  . Smoking status: Never Smoker  . Smokeless tobacco: Never Used  Substance and Sexual Activity  . Alcohol use: No  . Drug use: Not on file  . Sexual activity: Not on file  Other Topics Concern  . Not on file  Social History Narrative  . Not on file   Social Determinants of Health   Financial Resource Strain: Not on file  Food Insecurity: Not on file  Transportation Needs: Not on file  Physical Activity: Not on file  Stress: Not on file  Social Connections: Not on file  Intimate Partner Violence: Not on file    Family History  Problem Relation Age of Onset  . Breast cancer Mother 40  . Breast cancer Sister 40    Anti-infectives: Anti-infectives (From admission, onward)   Start     Dose/Rate Route Frequency Ordered Stop   06/18/20 1000  piperacillin-tazobactam (ZOSYN) IVPB   4.5 g        4.5 g 200 mL/hr over 30 Minutes Intravenous  Once 06/18/20 7412 06/18/20 1037      Current Facility-Administered Medications  Medication Dose Route Frequency Provider Last Rate Last Admin  . 0.9 %  sodium chloride infusion  250 mL Intravenous Continuous Blake Divine, MD      . acetaminophen (TYLENOL) tablet 650 mg  650 mg Oral Q6H PRN Ivor Costa, MD      . lactated ringers infusion   Intravenous Continuous Ivor Costa, MD      . norepinephrine (LEVOPHED) 4mg  in 254mL premix infusion  2-10 mcg/min Intravenous Titrated Blake Divine, MD 11.25 mL/hr at 06/18/20 1125 3 mcg/min at 06/18/20 1125  . ondansetron (ZOFRAN) injection 4 mg  4 mg Intravenous Q8H PRN Ivor Costa, MD       Current  Outpatient Medications  Medication Sig Dispense Refill  . allopurinol (ZYLOPRIM) 100 MG tablet Take 100 mg by mouth daily.    Marland Kitchen amLODipine (NORVASC) 10 MG tablet Take 10 mg by mouth at bedtime. HS    . carvedilol (COREG) 6.25 MG tablet Take 6.25 mg by mouth 2 (two) times daily with a meal.    . cefdinir (OMNICEF) 300 MG capsule Take 1 capsule (300 mg total) by mouth 2 (two) times daily for 5 days. 10 capsule 0  . losartan (COZAAR) 100 MG tablet Take 100 mg by mouth at bedtime.     . ondansetron (ZOFRAN ODT) 4 MG disintegrating tablet Take 1 tablet (4 mg total) by mouth every 8 (eight) hours as needed for nausea or vomiting. 20 tablet 0  . topiramate (TOPAMAX) 25 MG tablet Take 25 mg by mouth at bedtime. HS       Objective: Vital signs in last 24 hours: BP (!) 78/46   Pulse 81   Temp 99.1 F (37.3 C) (Oral)   Resp (!) 26   Wt 125 kg   SpO2 90%   BMI 45.86 kg/m   Intake/Output from previous day: No intake/output data recorded. Intake/Output this shift: No intake/output data recorded.   Physical Exam Vitals reviewed.  Constitutional:      Appearance: Normal appearance. She is obese.  HENT:     Head: Normocephalic and atraumatic.  Cardiovascular:     Rate and Rhythm: Regular rhythm. Tachycardia present.     Heart sounds: Normal heart sounds.  Pulmonary:     Effort: Pulmonary effort is normal.     Breath sounds: Normal breath sounds.  Abdominal:     Palpations: Abdomen is soft.     Tenderness: There is abdominal tenderness. There is right CVA tenderness.  Musculoskeletal:        General: No swelling or tenderness. Normal range of motion.     Cervical back: Normal range of motion and neck supple.  Skin:    General: Skin is warm and dry.  Neurological:     General: No focal deficit present.     Mental Status: She is alert and oriented to person, place, and time.  Psychiatric:        Mood and Affect: Mood normal.        Behavior: Behavior normal.     Lab Results:   Results for orders placed or performed during the hospital encounter of 06/18/20 (from the past 24 hour(s))  CBC with Differential     Status: Abnormal   Collection Time: 06/18/20  9:03 AM  Result Value Ref Range   WBC 31.2 (H)  4.0 - 10.5 K/uL   RBC 4.42 3.87 - 5.11 MIL/uL   Hemoglobin 14.1 12.0 - 15.0 g/dL   HCT 42.6 36.0 - 46.0 %   MCV 96.4 80.0 - 100.0 fL   MCH 31.9 26.0 - 34.0 pg   MCHC 33.1 30.0 - 36.0 g/dL   RDW 13.0 11.5 - 15.5 %   Platelets 126 (L) 150 - 400 K/uL   nRBC 0.0 0.0 - 0.2 %   Neutrophils Relative % 85 %   Neutro Abs 26.4 (H) 1.7 - 7.7 K/uL   Lymphocytes Relative 4 %   Lymphs Abs 1.3 0.7 - 4.0 K/uL   Monocytes Relative 4 %   Monocytes Absolute 1.3 (H) 0.1 - 1.0 K/uL   Eosinophils Relative 0 %   Eosinophils Absolute 0.1 0.0 - 0.5 K/uL   Basophils Relative 0 %   Basophils Absolute 0.0 0.0 - 0.1 K/uL   WBC Morphology MILD LEFT SHIFT (1-5% METAS, OCC MYELO, OCC BANDS)    RBC Morphology MORPHOLOGY UNREMARKABLE    Smear Review Normal platelet morphology    Immature Granulocytes 7 %   Abs Immature Granulocytes 2.21 (H) 0.00 - 0.07 K/uL  Comprehensive metabolic panel     Status: Abnormal   Collection Time: 06/18/20  9:03 AM  Result Value Ref Range   Sodium 138 135 - 145 mmol/L   Potassium 3.9 3.5 - 5.1 mmol/L   Chloride 107 98 - 111 mmol/L   CO2 16 (L) 22 - 32 mmol/L   Glucose, Bld 96 70 - 99 mg/dL   BUN 34 (H) 8 - 23 mg/dL   Creatinine, Ser 2.64 (H) 0.44 - 1.00 mg/dL   Calcium 8.2 (L) 8.9 - 10.3 mg/dL   Total Protein 6.5 6.5 - 8.1 g/dL   Albumin 2.9 (L) 3.5 - 5.0 g/dL   AST 40 15 - 41 U/L   ALT 20 0 - 44 U/L   Alkaline Phosphatase 79 38 - 126 U/L   Total Bilirubin 0.8 0.3 - 1.2 mg/dL   GFR, Estimated 19 (L) >60 mL/min   Anion gap 15 5 - 15  Resp Panel by RT-PCR (Flu A&B, Covid) Nasopharyngeal Swab     Status: None   Collection Time: 06/18/20  9:55 AM   Specimen: Nasopharyngeal Swab; Nasopharyngeal(NP) swabs in vial transport medium  Result Value Ref  Range   SARS Coronavirus 2 by RT PCR NEGATIVE NEGATIVE   Influenza A by PCR NEGATIVE NEGATIVE   Influenza B by PCR NEGATIVE NEGATIVE  Lactic acid, plasma     Status: Abnormal   Collection Time: 06/18/20 11:03 AM  Result Value Ref Range   Lactic Acid, Venous 3.3 (HH) 0.5 - 1.9 mmol/L    BMET Recent Labs    06/17/20 1338 06/18/20 0903  NA 140 138  K 4.0 3.9  CL 106 107  CO2 23 16*  GLUCOSE 112* 96  BUN 22 34*  CREATININE 1.46* 2.64*  CALCIUM 9.2 8.2*   PT/INR Recent Labs    06/17/20 1338  LABPROT 12.8  INR 1.0   ABG No results for input(s): PHART, HCO3 in the last 72 hours.  Invalid input(s): PCO2, PO2  Studies/Results: DG Chest 2 View  Result Date: 06/17/2020 CLINICAL DATA:  Abdominal pain and urinary frequency. EXAM: CHEST - 2 VIEW COMPARISON:  01/28/2007 FINDINGS: The heart is enlarged. Mild tortuosity of the thoracic aorta. The pulmonary hila appear unremarkable. No acute pulmonary findings. No infiltrates or effusions. No worrisome pulmonary lesions. The bony thorax is  intact. IMPRESSION: Mild cardiac enlargement but no acute pulmonary findings. Electronically Signed   By: Marijo Sanes M.D.   On: 06/17/2020 14:37   DG OR UROLOGY CYSTO IMAGE ([facility] ONLY)  Result Date: 06/18/2020 There is no interpretation for this exam.  This order is for images obtained during a surgical procedure.  Please See "Surgeries" Tab for more information regarding the procedure.   CT Renal Stone Study  Result Date: 06/18/2020 CLINICAL DATA:  Abdominal pain with nausea and vomiting EXAM: CT ABDOMEN AND PELVIS WITHOUT CONTRAST TECHNIQUE: Multidetector CT imaging of the abdomen and pelvis was performed following the standard protocol without oral or IV contrast. COMPARISON:  None. FINDINGS: Lower chest: Lung bases are clear. Hepatobiliary: There is hepatic steatosis. No focal liver lesions are evident on this noncontrast enhanced study. There are laminated gallstones within the gallbladder. The  gallbladder wall does not appear thickened. There is no appreciable biliary duct dilatation. Pancreas: There is no appreciable pancreatic mass or inflammatory focus. Fatty infiltration is noted in portions of the head of the pancreas. Spleen: No splenic lesions are evident. Adrenals/Urinary Tract: Adrenals bilaterally appear normal. The right kidney is edematous. There is a suspected parapelvic cyst on the right measuring 3.5 x 2.7 cm. There is also mild hydronephrosis on the right. There is a calculus in the lower pole of the right kidney in the lateral pelvic region measuring 1.1 x 0.9 cm. No other renal calculi are evident. There is subtle edema around the proximal right ureter. No ureteral calculus is evident. There is perinephric soft tissue thickening on the right with thickening of the mesentery in the right mid abdomen extending inferiorly as well as medial and lateral to the right kidney. No well-defined abscess seen in this area. There is a calculus at the right ureterovesical junction measuring 4 x 3 mm. There is thickening of the wall of the urinary bladder. Stomach/Bowel: There is no appreciable bowel wall or mesenteric thickening. Loops of small bowel extend into a pelvic midline ventral hernia without demonstrable bowel compromise. There is no appreciable bowel obstruction. Terminal ileum appears normal. There is no evident free air or portal venous air. Vascular/Lymphatic: There are foci of aortic atherosclerosis. No adenopathy is evident in the abdomen or pelvis. Reproductive: Uterus is absent.  No adnexal mass lesions evident. Other: No periappendiceal region inflammation. No ascites or abscess in the abdomen pelvis. As stated above, there is a midline pelvic ventral hernia containing loops of small bowel. This hernia at its neck measures 3.8 cm from right to left dimension and 2.7 cm from superior to inferior dimension. There is a hernia along the left lower quadrant abdominal wall which contains  fat but no bowel. This hernia at its neck measures 5.1 cm from right to left dimension and 3.1 cm from superior to inferior dimension. There is a smaller midline ventral hernia near the umbilicus which contains fat but no bowel. This hernia measures 1.5 cm from right to left dimension at its neck and 2.6 cm from superior to inferior dimension at its neck. There are areas of scarring in the anterior lower abdominal and pelvic wall regions. Musculoskeletal: There is degenerative change in the lower thoracic and lumbar regions. 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 There is spinal stenosis at L2-3, L3-4, and L4-5 due to disc protrusion and bony hypertrophy. No blastic or lytic bone lesions. No intramuscular lesions are evident. IMPRESSION: 1. Mild hydronephrosis on the right. There is a 4 x 3 mm calculus at the right  ureterovesical junction. The right kidney is edematous with soft tissue stranding and thickening throughout perinephric fascia on the right. Focal right renal abscess. The changes in the mesentery on the right may be due to the ureteral calculus but also could be indicative of a degree of pyelonephritis on the right. There is also a calculus in the lower pole right kidney measuring 1.1 x 0.9 cm. 2. Thickening of the urinary bladder wall, a finding likely indicative of a degree of cystitis. 3.  Cholelithiasis.  No gallbladder wall thickening. 4. Ventral hernias as noted. There are loops of small bowel in a midline ventral pelvic hernia without bowel compromise. Other hernias containing fat but no bowel. 5. Multilevel spinal stenosis due to disc protrusion and bony hypertrophy. 6.  Aortic Atherosclerosis (ICD10-I70.0). 7.  Hepatic steatosis. 8.  Uterus absent. Electronically Signed   By: Lowella Grip III M.D.   On: 06/18/2020 09:47   I have discussed the case with Dr. Charna Archer and reviewed the pertinent notes, labs and imaging.   Assessment/Plan: 23mm RUVJ stone with sepsis and focal right renal abscess..   I  will get her up for cystoscopy with right ureteral stenting.   Risks reviewed.   1.1cm RLP stone.  This can be address at secondary procedure once infection has resolved.         No follow-ups on file.    CC: Dr. Blake Divine and Dr. Dione Booze.     Irine Seal 06/18/2020 [phone]

## 2020-06-18 NOTE — ED Provider Notes (Signed)
[facility] Emergency Department Provider Note   ____________________________________________   Event Date/Time   First MD Initiated Contact with Patient 06/18/20 [phone]     (approximate)  I have reviewed the triage vital signs and the nursing notes.   HISTORY  Chief Complaint Abnormal Lab (+blood cx's)    HPI ALEXI DORMINEY is a 70 y.o. female with past medical history of hypertension, gout, and migraines who presents to the ED complaining of abnormal labs.  Patient was seen in the ED yesterday for 24 hours of generalized weakness, urinary frequency, and lower abdominal discomfort.  She was diagnosed with a UTI at that time and started on cefdinir.  She states that she had a hard time sleeping last night and developed chills this morning, felt like she was feverish but has not taken her temperature.  Pain has also extended into her right flank.  She describes this pain as sharp and constant, not exacerbated or alleviated by anything.  She felt nauseous overnight, but has not vomited.  She received a call this morning that her blood cultures were growing gram-negative rods and that she needed to come back to the hospital.        Past Medical History:  Diagnosis Date  . Anginal pain ([place])   . Complication of anesthesia    Nausea postoperatively with cataract surgery  . Fluid collection (edema) in the arms, legs, hands and feet   . Gout   . Headache    MIGRAINES  . Heart murmur   . Palpitations     Patient Active Problem List   Diagnosis Date Noted  . Acute pyelonephritis 06/18/2020  . Obstructive nephropathy 06/18/2020  . HTN (hypertension) 06/18/2020  . Gout 06/18/2020  . Migraine 06/18/2020  . Septic shock ([place]) 06/18/2020    Past Surgical History:  Procedure Laterality Date  . ABDOMINAL HYSTERECTOMY    . CATARACT EXTRACTION W/PHACO Right 10/04/2015   Procedure: CATARACT EXTRACTION PHACO AND INTRAOCULAR LENS PLACEMENT (IOC);  Surgeon: Birder Robson, MD;  Location: [facility] ORS;  Service: Ophthalmology;  Laterality: Right;  [place] 36.0 AP% 21.9 CDE 7.88 fluid pack lot # [id number]  . CATARACT EXTRACTION W/PHACO Left 11/01/2015   Procedure: CATARACT EXTRACTION PHACO AND INTRAOCULAR LENS PLACEMENT (IOC);  Surgeon: Birder Robson, MD;  Location: [facility] ORS;  Service: Ophthalmology;  Laterality: Left;  [place] 00:33 AP% 23.1 CDE 7.76 fluid pack lot # [id number]  . OOPHORECTOMY      Prior to Admission medications   Medication Sig Start Date End Date Taking? Authorizing Provider  allopurinol (ZYLOPRIM) 100 MG tablet Take 100 mg by mouth daily.    [provider]  amLODipine (NORVASC) 10 MG tablet Take 10 mg by mouth at bedtime. HS    [provider]  carvedilol (COREG) 6.25 MG tablet Take 6.25 mg by mouth 2 (two) times daily with a meal.    [provider]  cefdinir (OMNICEF) 300 MG capsule Take 1 capsule (300 mg total) by mouth 2 (two) times daily for 5 days. 06/17/20 06/22/20  Naaman Plummer, MD  losartan (COZAAR) 100 MG tablet Take 100 mg by mouth at bedtime.     [provider]  ondansetron (ZOFRAN ODT) 4 MG disintegrating tablet Take 1 tablet (4 mg total) by mouth every 8 (eight) hours as needed for nausea or vomiting. 06/17/20   Naaman Plummer, MD  topiramate (TOPAMAX) 25 MG tablet Take 25 mg by mouth at bedtime. HS    [provider]    Allergies Lisinopril  Family History  Problem Relation Age of Onset  . Breast cancer Mother 62  . Breast cancer Sister 41    Social History Social History   Tobacco Use  . Smoking status: Never Smoker  . Smokeless tobacco: Never Used  Substance Use Topics  . Alcohol use: No    Review of Systems  Constitutional: Positive for subjective fever/chills Eyes: No visual changes. ENT: No sore throat. Cardiovascular: Denies chest pain. Respiratory: Denies shortness of breath. Gastrointestinal: Positive for abdominal pain, flank pain, and nausea, no  vomiting.  No diarrhea.  No constipation. Genitourinary: Negative for dysuria.  Positive for urinary frequency. Musculoskeletal: Negative for back pain. Skin: Negative for rash. Neurological: Negative for headaches, focal weakness or numbness.  ____________________________________________   PHYSICAL EXAM:  VITAL SIGNS: ED Triage Vitals  Enc Vitals Group     BP 06/18/20 0900 (!) 100/52     Pulse Rate 06/18/20 0900 91     Resp 06/18/20 0900 20     Temp 06/18/20 0900 99.1 F (37.3 C)     Temp Source 06/18/20 0900 Oral     SpO2 06/18/20 0900 95 %     Weight 06/18/20 0847 275 lb 9.2 oz (125 kg)     Height --      Head Circumference --      Peak Flow --      Pain Score --      Pain Loc --      Pain Edu? --      Excl. in [place]? --     Constitutional: Alert and oriented. Eyes: Conjunctivae are normal. Head: Atraumatic. Nose: No congestion/rhinnorhea. Mouth/Throat: Mucous membranes are dry. Neck: Normal ROM Cardiovascular: Normal rate, regular rhythm. Grossly normal heart sounds. Respiratory: Normal respiratory effort.  No retractions. Lungs CTAB. Gastrointestinal: Soft and tender to palpation in the suprapubic area.  Right CVA tenderness noted. No distention. Genitourinary: deferred Musculoskeletal: No lower extremity tenderness nor edema. Neurologic:  Normal speech and language. No gross focal neurologic deficits are appreciated. Skin:  Skin is warm, dry and intact. No rash noted. Psychiatric: Mood and affect are normal. Speech and behavior are normal.  ____________________________________________   LABS (all labs ordered are listed, but only abnormal results are displayed)  Labs Reviewed  CBC WITH DIFFERENTIAL/PLATELET - Abnormal; Notable for the following components:      Result Value   WBC 31.2 (*)    Platelets 126 (*)    Neutro Abs 26.4 (*)    Monocytes Absolute 1.3 (*)    Abs Immature Granulocytes 2.21 (*)    All other components within normal limits   COMPREHENSIVE METABOLIC PANEL - Abnormal; Notable for the following components:   CO2 16 (*)    BUN 34 (*)    Creatinine, Ser 2.64 (*)    Calcium 8.2 (*)    Albumin 2.9 (*)    GFR, Estimated 19 (*)    All other components within normal limits  LACTIC ACID, PLASMA - Abnormal; Notable for the following components:   Lactic Acid, Venous 3.3 (*)    All other components within normal limits  RESP PANEL BY RT-PCR (FLU A&B, COVID) ARPGX2  CULTURE, BLOOD (SINGLE)  URINALYSIS, COMPLETE (UACMP) WITH MICROSCOPIC  LACTIC ACID, PLASMA  BRAIN NATRIURETIC PEPTIDE  LACTIC ACID, PLASMA  LACTIC ACID, PLASMA  APTT  PROCALCITONIN  HIV ANTIBODY (ROUTINE TESTING W REFLEX)   ____________________________________________  EKG  ED ECG REPORT I, Blake Divine, the attending  physician, personally viewed and interpreted this ECG.   Date: 06/18/2020  EKG Time: 14:05  Rate: 103  Rhythm: sinus tachycardia  Axis: Normal  Intervals:none  ST&T Change: None   PROCEDURES  Procedure(s) performed (including Critical Care):  .Critical Care Performed by: Blake Divine, MD Authorized by: Blake Divine, MD   Critical care provider statement:    Critical care time (minutes):  45   Critical care time was exclusive of:  Separately billable procedures and treating other patients and teaching time   Critical care was necessary to treat or prevent imminent or life-threatening deterioration of the following conditions:  Sepsis   Critical care was time spent personally by me on the following activities:  Discussions with consultants, evaluation of patient's response to treatment, examination of patient, ordering and performing treatments and interventions, ordering and review of laboratory studies, ordering and review of radiographic studies, pulse oximetry, re-evaluation of patient's condition, obtaining history from patient or surrogate and review of old charts   I assumed direction of critical care for  this patient from another provider in my specialty: no       ____________________________________________   INITIAL IMPRESSION / ASSESSMENT AND PLAN / ED COURSE       70 year old female with past medical history of hypertension, gout, and migraines who presents to the ED complaining of right flank pain, subjective fevers and chills, nausea, and ongoing urinary frequency after being diagnosed with UTI yesterday.  All 4 of her blood cultures are now growing gram-negative rods, although her urine culture has not yet resulted.  Presentation is concerning for sepsis with her borderline low blood pressure and she does appear dehydrated.  We will start resuscitation with IV fluids and broad-spectrum antibiotics.  Lab work is pending, we will also assess for nephrolithiasis or other urinary obstruction with CT scan.  ----------------------------------------- 10:27 AM on 06/18/2020 -----------------------------------------  CT scan reviewed by me and is concerning for infected kidney stone with a 4 mm stone at the right UVJ and mild associated hydronephrosis.  Patient remains awake and alert with improved MAP following initiation of IV fluids.  Case discussed with Dr. Jeffie Pollock of urology, who will plan for stent placement.  Labs thus far show marked leukocytosis consistent with diagnosis of sepsis.  ----------------------------------------- 11:29 AM on 06/18/2020 -----------------------------------------  Patient's BP now downtrending despite IV fluid resuscitation.  Her last few MAP's have been in the 50s and we will start Levophed peripherally.  Hospitalist admission canceled and case discussed with Dr. Mortimer Fries in the ICU for admission.  We will continue IV fluid resuscitation based off of ideal body weight.      ____________________________________________   FINAL CLINICAL IMPRESSION(S) / ED DIAGNOSES  Final diagnoses:  Sepsis with acute renal failure and septic shock, due to unspecified  organism, unspecified acute renal failure type [facility])  Pyelonephritis  Kidney stone     ED Discharge Orders    None       Note:  This document was prepared using Dragon voice recognition software and may include unintentional dictation errors.   Blake Divine, MD 06/18/20 [phone]

## 2020-06-18 NOTE — Transfer of Care (Signed)
Immediate Anesthesia Transfer of Care Note  Patient: Heather Simmons  Procedure(s) Performed: CYSTOSCOPY WITH STENT PLACEMENT (Right Ureter)  Patient Location: PACU and ICU  Anesthesia Type:General  Level of Consciousness: awake  Airway & Oxygen Therapy: Patient Spontanous Breathing and Patient connected to face mask oxygen  Post-op Assessment: Report given to RN  Post vital signs: Reviewed  Last Vitals:  Vitals Value Taken Time  BP 73/56 06/18/20 1330  Temp 37.1 C 06/18/20 1330  Pulse 68 06/18/20 1335  Resp 21 06/18/20 1335  SpO2 94 % 06/18/20 1335  Vitals shown include unvalidated device data.  Last Pain:  Vitals:   06/18/20 1330  TempSrc: Axillary  PainSc:          Complications: No complications documented.

## 2020-06-18 NOTE — Anesthesia Procedure Notes (Signed)
Performed by: Carrson Lightcap, CRNA       

## 2020-06-18 NOTE — Anesthesia Procedure Notes (Signed)
Performed by: Orlene Salmons, CRNA       

## 2020-06-18 NOTE — Op Note (Signed)
Procedure: Cystoscopy with insertion of right double-J stent.  Preop diagnosis: 4 mm right distal ureteral stone with obstruction and sepsis.  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  Surgeon: Dr. Irine Seal.  Anesthesia: General.  Specimen: None.  Drains: 6 French by 24 cm right contour double-J stent without tether and Foley catheter.  EBL: None.  Complications: None.  Indications: The patient is a 70 year old female who has a 4 mm right distal ureteral stone with obstruction and sepsis and is to undergo cystoscopy and stenting.  Procedure: She was given Zosyn in the ER.  She was taken operating room where general anesthetic was induced.  She was placed in lithotomy position.  She was prepped with Betadine scrub followed by Betadine solution and draped in usual sterile fashion.  Cystoscopy was performed using a 23 [place] scope and the 30 degree lens.  Examination revealed a normal urethra.  The bladder wall had findings consistent with diffuse chronic follicular cystitis that was visible after the turbid urine have been irrigated from the bladder.  Ureteral orifices were unremarkable.  The right ureteral orifice did not effluxed urine.  The left ureteral orifice was draining clear urine.  A 5 French open-ended catheter was then passed into the right ureteral orifice followed by an angled tip sensor wire.  There was some initial resistance to passage but the wire eventually progressed.  There was some tortuosity of the proximal ureter but by advancing the open-ended catheter I was able to get the sensor wire into the kidney.  The open-ended catheter was removed and a 6 [place] by 24 cm contour double-J stent was passed over the wire to the kidney under fluoroscopic guidance.  The wire was removed leaving a good coil in the kidney and a good coil in the bladder.  There was turbid purulent urine effluxing from the stent loops after placement.  The  cystoscope was removed and a 16 French Foley catheter was inserted.  She was taken down from lithotomy position, her anesthetic was reversed and she was moved to recovery room in guarded condition.  There were no complications.

## 2020-06-18 NOTE — H&P (View-Only) (Signed)
Subjective: CC: Right flank pain with fever.    I was asked to see Heather Simmons in consultation by Dr. Charna Archer for the finding of a 26mm RUVJ stone with obstruction and sepsis.  She had the onset yesterday of fever and chills with urgency and frequency and was seen in the ER which she was treated for a UTI and sent home but returned today with progressive symptoms and was found to be septic. A CT demonstrated a 68mm RUVJ stone with obstruction, a focal right renal abscess and a 1.1cm RLP stone.  She has some right flank pain.  She has no prior history of stones or UTI's.  She has had no GU surgery.   She is hypotensive in the ER with a WBC of 31K and AKI with a Cr of 2.64 which is up from 1.46 on 2/11.   ROS:  Review of Systems  Constitutional: Positive for chills and fever.  Respiratory: Positive for shortness of breath.   Cardiovascular: Negative for chest pain.  Gastrointestinal: Positive for abdominal pain.  Genitourinary: Positive for flank pain, frequency and urgency.  All other systems reviewed and are negative.   Allergies  Allergen Reactions  . Lisinopril Other (See Comments)    Intolerance - HAs    Past Medical History:  Diagnosis Date  . Anginal pain ([place])   . Complication of anesthesia    Nausea postoperatively with cataract surgery  . Fluid collection (edema) in the arms, legs, hands and feet   . Gout   . Headache    MIGRAINES  . Heart murmur   . Palpitations     Past Surgical History:  Procedure Laterality Date  . ABDOMINAL HYSTERECTOMY    . CATARACT EXTRACTION W/PHACO Right 10/04/2015   Procedure: CATARACT EXTRACTION PHACO AND INTRAOCULAR LENS PLACEMENT (IOC);  Surgeon: Birder Robson, MD;  Location: [facility] ORS;  Service: Ophthalmology;  Laterality: Right;  [place] 36.0 AP% 21.9 CDE 7.88 fluid pack lot # [id number]  . CATARACT EXTRACTION W/PHACO Left 11/01/2015   Procedure: CATARACT EXTRACTION PHACO AND INTRAOCULAR LENS PLACEMENT (IOC);  Surgeon: Birder Robson, MD;   Location: [facility] ORS;  Service: Ophthalmology;  Laterality: Left;  [place] 00:33 AP% 23.1 CDE 7.76 fluid pack lot # [id number]  . OOPHORECTOMY      Social History   Socioeconomic History  . Marital status: Single    Spouse name: Not on file  . Number of children: Not on file  . Years of education: Not on file  . Highest education level: Not on file  Occupational History  . Not on file  Tobacco Use  . Smoking status: Never Smoker  . Smokeless tobacco: Never Used  Substance and Sexual Activity  . Alcohol use: No  . Drug use: Not on file  . Sexual activity: Not on file  Other Topics Concern  . Not on file  Social History Narrative  . Not on file   Social Determinants of Health   Financial Resource Strain: Not on file  Food Insecurity: Not on file  Transportation Needs: Not on file  Physical Activity: Not on file  Stress: Not on file  Social Connections: Not on file  Intimate Partner Violence: Not on file    Family History  Problem Relation Age of Onset  . Breast cancer Mother 14  . Breast cancer Sister 23    Anti-infectives: Anti-infectives (From admission, onward)   Start     Dose/Rate Route Frequency Ordered Stop   06/18/20 1000  piperacillin-tazobactam (ZOSYN) IVPB  4.5 g        4.5 g 200 mL/hr over 30 Minutes Intravenous  Once 06/18/20 2694 06/18/20 1037      Current Facility-Administered Medications  Medication Dose Route Frequency Provider Last Rate Last Admin  . 0.9 %  sodium chloride infusion  250 mL Intravenous Continuous Blake Divine, MD      . acetaminophen (TYLENOL) tablet 650 mg  650 mg Oral Q6H PRN Ivor Costa, MD      . lactated ringers infusion   Intravenous Continuous Ivor Costa, MD      . norepinephrine (LEVOPHED) 4mg  in 281mL premix infusion  2-10 mcg/min Intravenous Titrated Blake Divine, MD 11.25 mL/hr at 06/18/20 1125 3 mcg/min at 06/18/20 1125  . ondansetron (ZOFRAN) injection 4 mg  4 mg Intravenous Q8H PRN Ivor Costa, MD       Current  Outpatient Medications  Medication Sig Dispense Refill  . allopurinol (ZYLOPRIM) 100 MG tablet Take 100 mg by mouth daily.    Marland Kitchen amLODipine (NORVASC) 10 MG tablet Take 10 mg by mouth at bedtime. HS    . carvedilol (COREG) 6.25 MG tablet Take 6.25 mg by mouth 2 (two) times daily with a meal.    . cefdinir (OMNICEF) 300 MG capsule Take 1 capsule (300 mg total) by mouth 2 (two) times daily for 5 days. 10 capsule 0  . losartan (COZAAR) 100 MG tablet Take 100 mg by mouth at bedtime.     . ondansetron (ZOFRAN ODT) 4 MG disintegrating tablet Take 1 tablet (4 mg total) by mouth every 8 (eight) hours as needed for nausea or vomiting. 20 tablet 0  . topiramate (TOPAMAX) 25 MG tablet Take 25 mg by mouth at bedtime. HS       Objective: Vital signs in last 24 hours: BP (!) 78/46   Pulse 81   Temp 99.1 F (37.3 C) (Oral)   Resp (!) 26   Wt 125 kg   SpO2 90%   BMI 45.86 kg/m   Intake/Output from previous day: No intake/output data recorded. Intake/Output this shift: No intake/output data recorded.   Physical Exam Vitals reviewed.  Constitutional:      Appearance: Normal appearance. She is obese.  HENT:     Head: Normocephalic and atraumatic.  Cardiovascular:     Rate and Rhythm: Regular rhythm. Tachycardia present.     Heart sounds: Normal heart sounds.  Pulmonary:     Effort: Pulmonary effort is normal.     Breath sounds: Normal breath sounds.  Abdominal:     Palpations: Abdomen is soft.     Tenderness: There is abdominal tenderness. There is right CVA tenderness.  Musculoskeletal:        General: No swelling or tenderness. Normal range of motion.     Cervical back: Normal range of motion and neck supple.  Skin:    General: Skin is warm and dry.  Neurological:     General: No focal deficit present.     Mental Status: She is alert and oriented to person, place, and time.  Psychiatric:        Mood and Affect: Mood normal.        Behavior: Behavior normal.     Lab Results:   Results for orders placed or performed during the hospital encounter of 06/18/20 (from the past 24 hour(s))  CBC with Differential     Status: Abnormal   Collection Time: 06/18/20  9:03 AM  Result Value Ref Range   WBC 31.2 (H)  4.0 - 10.5 K/uL   RBC 4.42 3.87 - 5.11 MIL/uL   Hemoglobin 14.1 12.0 - 15.0 g/dL   HCT 42.6 36.0 - 46.0 %   MCV 96.4 80.0 - 100.0 fL   MCH 31.9 26.0 - 34.0 pg   MCHC 33.1 30.0 - 36.0 g/dL   RDW 13.0 11.5 - 15.5 %   Platelets 126 (L) 150 - 400 K/uL   nRBC 0.0 0.0 - 0.2 %   Neutrophils Relative % 85 %   Neutro Abs 26.4 (H) 1.7 - 7.7 K/uL   Lymphocytes Relative 4 %   Lymphs Abs 1.3 0.7 - 4.0 K/uL   Monocytes Relative 4 %   Monocytes Absolute 1.3 (H) 0.1 - 1.0 K/uL   Eosinophils Relative 0 %   Eosinophils Absolute 0.1 0.0 - 0.5 K/uL   Basophils Relative 0 %   Basophils Absolute 0.0 0.0 - 0.1 K/uL   WBC Morphology MILD LEFT SHIFT (1-5% METAS, OCC MYELO, OCC BANDS)    RBC Morphology MORPHOLOGY UNREMARKABLE    Smear Review Normal platelet morphology    Immature Granulocytes 7 %   Abs Immature Granulocytes 2.21 (H) 0.00 - 0.07 K/uL  Comprehensive metabolic panel     Status: Abnormal   Collection Time: 06/18/20  9:03 AM  Result Value Ref Range   Sodium 138 135 - 145 mmol/L   Potassium 3.9 3.5 - 5.1 mmol/L   Chloride 107 98 - 111 mmol/L   CO2 16 (L) 22 - 32 mmol/L   Glucose, Bld 96 70 - 99 mg/dL   BUN 34 (H) 8 - 23 mg/dL   Creatinine, Ser 2.64 (H) 0.44 - 1.00 mg/dL   Calcium 8.2 (L) 8.9 - 10.3 mg/dL   Total Protein 6.5 6.5 - 8.1 g/dL   Albumin 2.9 (L) 3.5 - 5.0 g/dL   AST 40 15 - 41 U/L   ALT 20 0 - 44 U/L   Alkaline Phosphatase 79 38 - 126 U/L   Total Bilirubin 0.8 0.3 - 1.2 mg/dL   GFR, Estimated 19 (L) >60 mL/min   Anion gap 15 5 - 15  Resp Panel by RT-PCR (Flu A&B, Covid) Nasopharyngeal Swab     Status: None   Collection Time: 06/18/20  9:55 AM   Specimen: Nasopharyngeal Swab; Nasopharyngeal(NP) swabs in vial transport medium  Result Value Ref  Range   SARS Coronavirus 2 by RT PCR NEGATIVE NEGATIVE   Influenza A by PCR NEGATIVE NEGATIVE   Influenza B by PCR NEGATIVE NEGATIVE  Lactic acid, plasma     Status: Abnormal   Collection Time: 06/18/20 11:03 AM  Result Value Ref Range   Lactic Acid, Venous 3.3 (HH) 0.5 - 1.9 mmol/L    BMET Recent Labs    06/17/20 1338 06/18/20 0903  NA 140 138  K 4.0 3.9  CL 106 107  CO2 23 16*  GLUCOSE 112* 96  BUN 22 34*  CREATININE 1.46* 2.64*  CALCIUM 9.2 8.2*   PT/INR Recent Labs    06/17/20 1338  LABPROT 12.8  INR 1.0   ABG No results for input(s): PHART, HCO3 in the last 72 hours.  Invalid input(s): PCO2, PO2  Studies/Results: DG Chest 2 View  Result Date: 06/17/2020 CLINICAL DATA:  Abdominal pain and urinary frequency. EXAM: CHEST - 2 VIEW COMPARISON:  01/28/2007 FINDINGS: The heart is enlarged. Mild tortuosity of the thoracic aorta. The pulmonary hila appear unremarkable. No acute pulmonary findings. No infiltrates or effusions. No worrisome pulmonary lesions. The bony thorax is  intact. IMPRESSION: Mild cardiac enlargement but no acute pulmonary findings. Electronically Signed   By: Marijo Sanes M.D.   On: 06/17/2020 14:37   DG OR UROLOGY CYSTO IMAGE ([facility] ONLY)  Result Date: 06/18/2020 There is no interpretation for this exam.  This order is for images obtained during a surgical procedure.  Please See "Surgeries" Tab for more information regarding the procedure.   CT Renal Stone Study  Result Date: 06/18/2020 CLINICAL DATA:  Abdominal pain with nausea and vomiting EXAM: CT ABDOMEN AND PELVIS WITHOUT CONTRAST TECHNIQUE: Multidetector CT imaging of the abdomen and pelvis was performed following the standard protocol without oral or IV contrast. COMPARISON:  None. FINDINGS: Lower chest: Lung bases are clear. Hepatobiliary: There is hepatic steatosis. No focal liver lesions are evident on this noncontrast enhanced study. There are laminated gallstones within the gallbladder. The  gallbladder wall does not appear thickened. There is no appreciable biliary duct dilatation. Pancreas: There is no appreciable pancreatic mass or inflammatory focus. Fatty infiltration is noted in portions of the head of the pancreas. Spleen: No splenic lesions are evident. Adrenals/Urinary Tract: Adrenals bilaterally appear normal. The right kidney is edematous. There is a suspected parapelvic cyst on the right measuring 3.5 x 2.7 cm. There is also mild hydronephrosis on the right. There is a calculus in the lower pole of the right kidney in the lateral pelvic region measuring 1.1 x 0.9 cm. No other renal calculi are evident. There is subtle edema around the proximal right ureter. No ureteral calculus is evident. There is perinephric soft tissue thickening on the right with thickening of the mesentery in the right mid abdomen extending inferiorly as well as medial and lateral to the right kidney. No well-defined abscess seen in this area. There is a calculus at the right ureterovesical junction measuring 4 x 3 mm. There is thickening of the wall of the urinary bladder. Stomach/Bowel: There is no appreciable bowel wall or mesenteric thickening. Loops of small bowel extend into a pelvic midline ventral hernia without demonstrable bowel compromise. There is no appreciable bowel obstruction. Terminal ileum appears normal. There is no evident free air or portal venous air. Vascular/Lymphatic: There are foci of aortic atherosclerosis. No adenopathy is evident in the abdomen or pelvis. Reproductive: Uterus is absent.  No adnexal mass lesions evident. Other: No periappendiceal region inflammation. No ascites or abscess in the abdomen pelvis. As stated above, there is a midline pelvic ventral hernia containing loops of small bowel. This hernia at its neck measures 3.8 cm from right to left dimension and 2.7 cm from superior to inferior dimension. There is a hernia along the left lower quadrant abdominal wall which contains  fat but no bowel. This hernia at its neck measures 5.1 cm from right to left dimension and 3.1 cm from superior to inferior dimension. There is a smaller midline ventral hernia near the umbilicus which contains fat but no bowel. This hernia measures 1.5 cm from right to left dimension at its neck and 2.6 cm from superior to inferior dimension at its neck. There are areas of scarring in the anterior lower abdominal and pelvic wall regions. Musculoskeletal: There is degenerative change in the lower thoracic and lumbar regions. 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 There is spinal stenosis at L2-3, L3-4, and L4-5 due to disc protrusion and bony hypertrophy. No blastic or lytic bone lesions. No intramuscular lesions are evident. IMPRESSION: 1. Mild hydronephrosis on the right. There is a 4 x 3 mm calculus at the right  ureterovesical junction. The right kidney is edematous with soft tissue stranding and thickening throughout perinephric fascia on the right. Focal right renal abscess. The changes in the mesentery on the right may be due to the ureteral calculus but also could be indicative of a degree of pyelonephritis on the right. There is also a calculus in the lower pole right kidney measuring 1.1 x 0.9 cm. 2. Thickening of the urinary bladder wall, a finding likely indicative of a degree of cystitis. 3.  Cholelithiasis.  No gallbladder wall thickening. 4. Ventral hernias as noted. There are loops of small bowel in a midline ventral pelvic hernia without bowel compromise. Other hernias containing fat but no bowel. 5. Multilevel spinal stenosis due to disc protrusion and bony hypertrophy. 6.  Aortic Atherosclerosis (ICD10-I70.0). 7.  Hepatic steatosis. 8.  Uterus absent. Electronically Signed   By: Lowella Grip III M.D.   On: 06/18/2020 09:47   I have discussed the case with Dr. Charna Archer and reviewed the pertinent notes, labs and imaging.   Assessment/Plan: 28mm RUVJ stone with sepsis and focal right renal abscess..   I  will get her up for cystoscopy with right ureteral stenting.   Risks reviewed.   1.1cm RLP stone.  This can be address at secondary procedure once infection has resolved.         No follow-ups on file.    CC: Dr. Blake Divine and Dr. Dione Booze.     Irine Seal 06/18/2020 [phone]

## 2020-06-18 NOTE — Anesthesia Preprocedure Evaluation (Addendum)
Anesthesia Evaluation  Patient identified by MRN, date of birth, ID band Patient awake    Reviewed: Allergy & Precautions, NPO status , Patient's Chart, lab work & pertinent test results  History of Anesthesia Complications Negative for: history of anesthetic complications  Airway Mallampati: III       Dental   Pulmonary neg sleep apnea, neg COPD,  O2 sats in low 90s on [place]          Cardiovascular hypertension, Pt. on medications and Pt. on home beta blockers (-) Past MI and (-) CHF (-) dysrhythmias (-) Valvular Problems/Murmurs (murmur, remote past)     Neuro/Psych neg Seizures    GI/Hepatic Neg liver ROS, neg GERD  ,  Endo/Other  neg diabetes  Renal/GU Renal disease (stones, uro sepsis)     Musculoskeletal   Abdominal   Peds  Hematology   Anesthesia Other Findings   Reproductive/Obstetrics                             Anesthesia Physical Anesthesia Plan  ASA: III and emergent  Anesthesia Plan: General   Post-op Pain Management:    Induction: Intravenous  PONV Risk Score and Plan: 3 and Ondansetron, Dexamethasone and Treatment may vary due to age or medical condition  Airway Management Planned: Oral ETT  Additional Equipment:   Intra-op Plan:   Post-operative Plan: Possible Post-op intubation/ventilation  Informed Consent: I have reviewed the patients History and Physical, chart, labs and discussed the procedure including the risks, benefits and alternatives for the proposed anesthesia with the patient or authorized representative who has indicated his/her understanding and acceptance.       Plan Discussed with:   Anesthesia Plan Comments: (Pt understands risks of need for post-op ventilation, as well as risks of heart, BP and other respiratory problems and wishes to proceed. )        Anesthesia Quick Evaluation

## 2020-06-18 NOTE — Anesthesia Procedure Notes (Signed)
Procedure Name: Intubation Date/Time: 06/18/2020 12:48 PM Performed by: Orion Crook, CRNA Pre-anesthesia Checklist: Patient identified, Emergency Drugs available, Suction available, Patient being monitored and Timeout performed Patient Re-evaluated:Patient Re-evaluated prior to induction Oxygen Delivery Method: Circle system utilized Preoxygenation: Pre-oxygenation with 100% oxygen Induction Type: IV induction and Rapid sequence Laryngoscope Size: McGraph and 3 Grade View: Grade I Tube type: Oral Tube size: 7.0 mm Number of attempts: 1 Airway Equipment and Method: Stylet Placement Confirmation: ETT inserted through vocal cords under direct vision,  positive ETCO2,  CO2 detector and breath sounds checked- equal and bilateral Secured at: 21 cm

## 2020-06-18 NOTE — Sepsis Progress Note (Signed)
elink is monitoring this code sepsis 

## 2020-06-18 NOTE — ED Triage Notes (Signed)
Pt in from home, returns to ED for +blood cx's. Seen yesterday for UTI s/s

## 2020-06-18 NOTE — ED Notes (Signed)
Patient transported to CT 

## 2020-06-19 ENCOUNTER — Inpatient Hospital Stay: Payer: Medicare Other

## 2020-06-19 DIAGNOSIS — N10 Acute tubulo-interstitial nephritis: Secondary | ICD-10-CM | POA: Diagnosis not present

## 2020-06-19 DIAGNOSIS — N132 Hydronephrosis with renal and ureteral calculous obstruction: Secondary | ICD-10-CM

## 2020-06-19 DIAGNOSIS — A419 Sepsis, unspecified organism: Secondary | ICD-10-CM | POA: Diagnosis not present

## 2020-06-19 DIAGNOSIS — N179 Acute kidney failure, unspecified: Secondary | ICD-10-CM

## 2020-06-19 DIAGNOSIS — R6521 Severe sepsis with septic shock: Secondary | ICD-10-CM | POA: Diagnosis not present

## 2020-06-19 LAB — CULTURE, BLOOD (ROUTINE X 2)

## 2020-06-19 LAB — CBC
HCT: 38.9 % (ref 36.0–46.0)
Hemoglobin: 13.1 g/dL (ref 12.0–15.0)
MCH: 32.7 pg (ref 26.0–34.0)
MCHC: 33.7 g/dL (ref 30.0–36.0)
MCV: 97 fL (ref 80.0–100.0)
Platelets: 100 10*3/uL — ABNORMAL LOW (ref 150–400)
RBC: 4.01 MIL/uL (ref 3.87–5.11)
RDW: 12.9 % (ref 11.5–15.5)
WBC: 35.6 10*3/uL — ABNORMAL HIGH (ref 4.0–10.5)
nRBC: 0 % (ref 0.0–0.2)

## 2020-06-19 LAB — BLOOD GAS, ARTERIAL
Acid-base deficit: 3.3 mmol/L — ABNORMAL HIGH (ref 0.0–2.0)
Bicarbonate: 22.1 mmol/L (ref 20.0–28.0)
FIO2: 0.32
O2 Saturation: 93.9 %
Patient temperature: 37
pCO2 arterial: 40 mmHg (ref 32.0–48.0)
pH, Arterial: 7.35 (ref 7.350–7.450)
pO2, Arterial: 74 mmHg — ABNORMAL LOW (ref 83.0–108.0)

## 2020-06-19 LAB — BASIC METABOLIC PANEL
Anion gap: 11 (ref 5–15)
BUN: 37 mg/dL — ABNORMAL HIGH (ref 8–23)
CO2: 22 mmol/L (ref 22–32)
Calcium: 7.8 mg/dL — ABNORMAL LOW (ref 8.9–10.3)
Chloride: 106 mmol/L (ref 98–111)
Creatinine, Ser: 1.82 mg/dL — ABNORMAL HIGH (ref 0.44–1.00)
GFR, Estimated: 30 mL/min — ABNORMAL LOW (ref >60)
Glucose, Bld: 78 mg/dL (ref 70–99)
Potassium: 3.7 mmol/L (ref 3.5–5.1)
Sodium: 139 mmol/L (ref 135–145)

## 2020-06-19 LAB — URINE CULTURE: Culture: >=100000 — AB

## 2020-06-19 LAB — PHOSPHORUS: Phosphorus: 4 mg/dL (ref 2.5–4.6)

## 2020-06-19 LAB — MAGNESIUM: Magnesium: 1.8 mg/dL (ref 1.7–2.4)

## 2020-06-19 MED ORDER — MAGNESIUM SULFATE 2 GM/50ML IV SOLN
2.0000 g | Freq: Once | INTRAVENOUS | Status: AC
  Administered 2020-06-19: 2 g via INTRAVENOUS
  Filled 2020-06-19: qty 50

## 2020-06-19 NOTE — Progress Notes (Signed)
70 y/o F w/ PMH of HTN, gout, migraines who is off of pressors secondary to e. coli bacteremia due to an infected kidney stone. Hospitalist service will take over care tomorrow.   This is a non-billable note

## 2020-06-19 NOTE — Progress Notes (Signed)
Name: Heather Simmons MRN: [id number] DOB: Apr 05, 1951     CONSULTATION DATE: 06/19/2020  REFERRING MD : Charna Archer  HISTORY OF PRESENT ILLNESS: 70 y.o. female with past medical history of hypertension, gout, and migraines who presents to the ED complaining of abnormal labs.    Patient was seen in the ED yesterday for 24 hours of generalized weakness, urinary frequency, and lower abdominal discomfort.    + diagnosed with a UTI at that time and started on cefdinir.   She states that she had a hard time sleeping last night and developed chills this morning  felt like she was feverish but has not taken her temperature.   Pain has also extended into her right flank.    She describes this pain as sharp and constant, not exacerbated or alleviated by anything.    She felt nauseous overnight, but has not vomited.  She received a call this morning that her blood cultures were growing gram-negative rods and that she needed to come back to the hospital.  ER source patient with severe hypotension started ABX Placed on pressors UROLOGY consulted Procedure: Cystoscopy with insertion of right double-J stent. Preop diagnosis: 4 mm right distal ureteral stone with obstruction and sepsis. 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  CC Follow up septic shock  HPI Alert and awake Weaning off pressors  PAST MEDICAL HISTORY :   has a past medical history of Anginal pain ([place]), Complication of anesthesia, Fluid collection (edema) in the arms, legs, hands and feet, Gout, Headache, Heart murmur, and Palpitations.  has a past surgical history that includes Abdominal hysterectomy; Cataract extraction w/PHACO (Right, 10/04/2015); Cataract extraction w/PHACO (Left, 11/01/2015); and Oophorectomy. Prior to Admission medications   Medication Sig Start Date End Date Taking? Authorizing Provider  allopurinol (ZYLOPRIM) 100 MG tablet Take 100 mg by mouth daily.   Yes [provider]  amLODipine (NORVASC) 10 MG tablet Take 10 mg by mouth at bedtime. HS   Yes [provider]  carvedilol (COREG) 6.25 MG tablet Take 6.25 mg by mouth 2 (two) times daily with a meal.   Yes [provider]  cefdinir (OMNICEF) 300 MG capsule Take 1 capsule (300 mg total) by mouth 2 (two) times daily for 5 days. 06/17/20 06/22/20 Yes Naaman Plummer, MD  losartan (COZAAR) 100 MG tablet Take 100 mg by mouth at bedtime.    Yes [provider]  ondansetron (ZOFRAN ODT) 4 MG disintegrating tablet Take 1 tablet (4 mg total) by mouth every 8 (eight) hours as needed for nausea or vomiting. 06/17/20  Yes Bradler, Vista Lawman, MD  topiramate (TOPAMAX) 25 MG tablet Take 25 mg by mouth at bedtime. HS   Yes [provider]   Allergies  Allergen Reactions  . Lisinopril Other (See Comments)    Intolerance - HAs    FAMILY HISTORY:  family history includes Breast cancer (age of onset: 75) in her mother and sister. SOCIAL HISTORY:  reports that she has never smoked. She has never used smokeless tobacco. She reports that she does not drink alcohol.    Review of Systems:  Gen:  Denies  fever, sweats, chills weight loss  HEENT: Denies blurred vision, double vision, ear pain, eye pain, hearing loss, nose bleeds, sore throat Cardiac:  No dizziness, chest pain or heaviness, chest tightness,edema, No JVD Resp:   No cough, -sputum production, -shortness of breath,-wheezing, -hemoptysis,  Gi: Denies swallowing difficulty, stomach pain, nausea or vomiting, diarrhea, constipation, bowel  incontinence Gu:  Denies bladder incontinence, burning urine Ext:   Denies Joint pain, stiffness or swelling Skin: Denies  skin rash, easy bruising or bleeding or hives Endoc:  Denies polyuria, polydipsia , polyphagia or weight change Psych:   Denies depression, insomnia or hallucinations  Other:  All other systems negative      VITAL SIGNS: Temp:  [98 F (36.7 C)-99.1 F (37.3 C)]  98.1 F (36.7 C) (02/13 0349) Pulse Rate:  [62-119] 67 (02/13 0700) Resp:  [18-30] 18 (02/13 0700) BP: (64-124)/(34-83) 96/55 (02/13 0700) SpO2:  [86 %-96 %] 93 % (02/13 0700) Weight:  [125 kg-127.8 kg] 127.8 kg (02/12 1330)     SpO2: 93 % O2 Flow Rate (L/min): 2.5 L/min    Physical Examination:   General Appearance: No distress  Neuro:without focal findings,  speech normal,  HEENT: PERRLA, EOM intact.   Pulmonary: normal breath sounds, No wheezing.  CardiovascularNormal S1,S2.  No m/r/g.   Abdomen: Benign, Soft, non-tender. Extremities: normal, no cyanosis, clubbing. PSYCHIATRIC: Mood, affect within normal limits.   ALL OTHER ROS ARE NEGATIVE    MEDICATIONS: I have reviewed all medications and confirmed regimen as documented    CULTURE RESULTS   Recent Results (from the past 240 hour(s))  Culture, blood (Routine x 2)     Status: None (Preliminary result)   Collection Time: 06/17/20  1:38 PM   Specimen: BLOOD  Result Value Ref Range Status   Specimen Description BLOOD RIGHT ANTECUBITAL  Final   Special Requests   Final    BOTTLES DRAWN AEROBIC AND ANAEROBIC Blood Culture adequate volume   Culture  Setup Time   Final    GRAM NEGATIVE RODS IN BOTH AEROBIC AND ANAEROBIC BOTTLES CRITICAL RESULT CALLED TO, READ BACK BY AND VERIFIED WITH: ANGELA ROBBINS AT [place] 06/18/20.PMF Performed at [facility], [address] [place], [place] [address]    Culture GRAM NEGATIVE RODS  Final   Report Status PENDING  Incomplete  Culture, blood (Routine x 2)     Status: None (Preliminary result)   Collection Time: 06/17/20  1:42 PM   Specimen: BLOOD  Result Value Ref Range Status   Specimen Description BLOOD BLOOD LEFT WRIST  Final   Special Requests   Final    BOTTLES DRAWN AEROBIC AND ANAEROBIC Blood Culture results may not be optimal due to an inadequate volume of blood received in culture bottles   Culture  Setup Time   Final    Organism ID to follow GRAM NEGATIVE  RODS IN BOTH AEROBIC AND ANAEROBIC BOTTLES CRITICAL RESULT CALLED TO, READ BACK BY AND VERIFIED WITH: ANGELA ROBBINS AT [place] 06/18/20.PMF Performed at [facility], [address] [place], [place] [address]    Culture GRAM NEGATIVE RODS  Final   Report Status PENDING  Incomplete  Blood Culture ID Panel (Reflexed)     Status: Abnormal   Collection Time: 06/17/20  1:42 PM  Result Value Ref Range Status   Enterococcus faecalis NOT DETECTED NOT DETECTED Final   Enterococcus Faecium NOT DETECTED NOT DETECTED Final   Listeria monocytogenes NOT DETECTED NOT DETECTED Final   Staphylococcus species NOT DETECTED NOT DETECTED Final   Staphylococcus aureus (BCID) NOT DETECTED NOT DETECTED Final   Staphylococcus epidermidis NOT DETECTED NOT DETECTED Final   Staphylococcus lugdunensis NOT DETECTED NOT DETECTED Final   Streptococcus species NOT DETECTED NOT DETECTED Final   Streptococcus agalactiae NOT DETECTED NOT DETECTED Final   Streptococcus pneumoniae NOT DETECTED NOT DETECTED Final  Streptococcus pyogenes NOT DETECTED NOT DETECTED Final   A.calcoaceticus-baumannii NOT DETECTED NOT DETECTED Final   Bacteroides fragilis NOT DETECTED NOT DETECTED Final   Enterobacterales DETECTED (A) NOT DETECTED Final    Comment: Enterobacterales represent a large order of gram negative bacteria, not a single organism. CRITICAL RESULT CALLED TO, READ BACK BY AND VERIFIED WITH: ANGELA ROBBINS AT 3845 06/18/20.PMF    Enterobacter cloacae complex NOT DETECTED NOT DETECTED Final   Escherichia coli DETECTED (A) NOT DETECTED Final    Comment: CRITICAL RESULT CALLED TO, READ BACK BY AND VERIFIED WITH: ANGELA ROBBINS AT 3646 06/18/20.PMF    Klebsiella aerogenes NOT DETECTED NOT DETECTED Final   Klebsiella oxytoca NOT DETECTED NOT DETECTED Final   Klebsiella pneumoniae NOT DETECTED NOT DETECTED Final   Proteus species NOT DETECTED NOT DETECTED Final   Salmonella species NOT DETECTED NOT DETECTED Final    Serratia marcescens NOT DETECTED NOT DETECTED Final   Haemophilus influenzae NOT DETECTED NOT DETECTED Final   Neisseria meningitidis NOT DETECTED NOT DETECTED Final   Pseudomonas aeruginosa NOT DETECTED NOT DETECTED Final   Stenotrophomonas maltophilia NOT DETECTED NOT DETECTED Final   Candida albicans NOT DETECTED NOT DETECTED Final   Candida auris NOT DETECTED NOT DETECTED Final   Candida glabrata NOT DETECTED NOT DETECTED Final   Candida krusei NOT DETECTED NOT DETECTED Final   Candida parapsilosis NOT DETECTED NOT DETECTED Final   Candida tropicalis NOT DETECTED NOT DETECTED Final   Cryptococcus neoformans/gattii NOT DETECTED NOT DETECTED Final   CTX-M ESBL NOT DETECTED NOT DETECTED Final   Carbapenem resistance IMP NOT DETECTED NOT DETECTED Final   Carbapenem resistance KPC NOT DETECTED NOT DETECTED Final   Carbapenem resistance NDM NOT DETECTED NOT DETECTED Final   Carbapenem resist OXA 48 LIKE NOT DETECTED NOT DETECTED Final   Carbapenem resistance VIM NOT DETECTED NOT DETECTED Final    Comment: Performed at [facility], [address] [place], [place] [address]  Resp Panel by RT-PCR (Flu A&B, Covid) Nasopharyngeal Swab     Status: None   Collection Time: 06/17/20  2:54 PM   Specimen: Nasopharyngeal Swab; Nasopharyngeal(NP) swabs in vial transport medium  Result Value Ref Range Status   SARS Coronavirus 2 by RT PCR NEGATIVE NEGATIVE Final    Comment: (NOTE) SARS-CoV-2 target nucleic acids are NOT DETECTED.  The SARS-CoV-2 RNA is generally detectable in upper respiratory specimens during the acute phase of infection. The lowest concentration of SARS-CoV-2 viral copies this assay can detect is 138 copies/mL. A negative result does not preclude SARS-Cov-2 infection and should not be used as the sole basis for treatment or other patient management decisions. A negative result may occur with  improper specimen collection/handling, submission of specimen other than  nasopharyngeal swab, presence of viral mutation(s) within the areas targeted by this assay, and inadequate number of viral copies(<138 copies/mL). A negative result must be combined with clinical observations, patient history, and epidemiological information. The expected result is Negative.  Fact Sheet for Patients:  EntrepreneurPulse.com.au  Fact Sheet for Healthcare Providers:  IncredibleEmployment.be  This test is no t yet approved or cleared by the [place] FDA and  has been authorized for detection and/or diagnosis of SARS-CoV-2 by FDA under an Emergency Use Authorization (EUA). This EUA will remain  in effect (meaning this test can be used) for the duration of the COVID-19 declaration under Section 564(b)(1) of the Act, 21 U.S.C.section 360bbb-3(b)(1), unless the authorization is terminated  or revoked sooner.  Influenza A by PCR NEGATIVE NEGATIVE Final   Influenza B by PCR NEGATIVE NEGATIVE Final    Comment: (NOTE) The Xpert Xpress SARS-CoV-2/FLU/RSV plus assay is intended as an aid in the diagnosis of influenza from Nasopharyngeal swab specimens and should not be used as a sole basis for treatment. Nasal washings and aspirates are unacceptable for Xpert Xpress SARS-CoV-2/FLU/RSV testing.  Fact Sheet for Patients: EntrepreneurPulse.com.au  Fact Sheet for Healthcare Providers: IncredibleEmployment.be  This test is not yet approved or cleared by the [place] FDA and has been authorized for detection and/or diagnosis of SARS-CoV-2 by FDA under an Emergency Use Authorization (EUA). This EUA will remain in effect (meaning this test can be used) for the duration of the COVID-19 declaration under Section 564(b)(1) of the Act, 21 U.S.C. section 360bbb-3(b)(1), unless the authorization is terminated or revoked.  Performed at [facility], [address] [place], [place]  [address]   Blood culture (single)     Status: None (Preliminary result)   Collection Time: 06/18/20  9:03 AM   Specimen: BLOOD LEFT WRIST  Result Value Ref Range Status   Specimen Description BLOOD LEFT WRIST  Final   Special Requests   Final    BOTTLES DRAWN AEROBIC AND ANAEROBIC Blood Culture adequate volume   Culture   Final    NO GROWTH < 24 HOURS Performed at [facility], [address] [place], [place] [address]    Report Status PENDING  Incomplete  Resp Panel by RT-PCR (Flu A&B, Covid) Nasopharyngeal Swab     Status: None   Collection Time: 06/18/20  9:55 AM   Specimen: Nasopharyngeal Swab; Nasopharyngeal(NP) swabs in vial transport medium  Result Value Ref Range Status   SARS Coronavirus 2 by RT PCR NEGATIVE NEGATIVE Final    Comment: (NOTE) SARS-CoV-2 target nucleic acids are NOT DETECTED.  The SARS-CoV-2 RNA is generally detectable in upper respiratory specimens during the acute phase of infection. The lowest concentration of SARS-CoV-2 viral copies this assay can detect is 138 copies/mL. A negative result does not preclude SARS-Cov-2 infection and should not be used as the sole basis for treatment or other patient management decisions. A negative result may occur with  improper specimen collection/handling, submission of specimen other than nasopharyngeal swab, presence of viral mutation(s) within the areas targeted by this assay, and inadequate number of viral copies(<138 copies/mL). A negative result must be combined with clinical observations, patient history, and epidemiological information. The expected result is Negative.  Fact Sheet for Patients:  EntrepreneurPulse.com.au  Fact Sheet for Healthcare Providers:  IncredibleEmployment.be  This test is no t yet approved or cleared by the [place] FDA and  has been authorized for detection and/or diagnosis of SARS-CoV-2 by FDA under an Emergency Use Authorization  (EUA). This EUA will remain  in effect (meaning this test can be used) for the duration of the COVID-19 declaration under Section 564(b)(1) of the Act, 21 U.S.C.section 360bbb-3(b)(1), unless the authorization is terminated  or revoked sooner.       Influenza A by PCR NEGATIVE NEGATIVE Final   Influenza B by PCR NEGATIVE NEGATIVE Final    Comment: (NOTE) The Xpert Xpress SARS-CoV-2/FLU/RSV plus assay is intended as an aid in the diagnosis of influenza from Nasopharyngeal swab specimens and should not be used as a sole basis for treatment. Nasal washings and aspirates are unacceptable for Xpert Xpress SARS-CoV-2/FLU/RSV testing.  Fact Sheet for Patients: EntrepreneurPulse.com.au  Fact Sheet for Healthcare Providers: IncredibleEmployment.be  This test is  not yet approved or cleared by the [place] and has been authorized for detection and/or diagnosis of SARS-CoV-2 by FDA under an Emergency Use Authorization (EUA). This EUA will remain in effect (meaning this test can be used) for the duration of the COVID-19 declaration under Section 564(b)(1) of the Act, 21 U.S.C. section 360bbb-3(b)(1), unless the authorization is terminated or revoked.  Performed at [facility], [address] [place], [place] [address]           IMAGING    DG Chest Port 1 View  Result Date: 06/19/2020 CLINICAL DATA:  Assess for interval change. Admitted patient with acute pyelonephritis. EXAM: PORTABLE CHEST 1 VIEW COMPARISON:  Radiograph 06/17/2020 FINDINGS: Stable lung volumes. Stable cardiomegaly. Unchanged mediastinal contours. No focal airspace disease. No pulmonary edema. No pleural fluid or pneumothorax. No acute osseous abnormalities are seen. IMPRESSION: No significant interval change.  Stable cardiomegaly. Electronically Signed   By: Keith Rake M.D.   On: 06/19/2020 03:47   DG OR UROLOGY CYSTO IMAGE ([place])  Result Date:  06/18/2020 There is no interpretation for this exam.  This order is for images obtained during a surgical procedure.  Please See "Surgeries" Tab for more information regarding the procedure.   CT Renal Stone Study  Result Date: 06/18/2020 CLINICAL DATA:  Abdominal pain with nausea and vomiting EXAM: CT ABDOMEN AND PELVIS WITHOUT CONTRAST TECHNIQUE: Multidetector CT imaging of the abdomen and pelvis was performed following the standard protocol without oral or IV contrast. COMPARISON:  None. FINDINGS: Lower chest: Lung bases are clear. Hepatobiliary: There is hepatic steatosis. No focal liver lesions are evident on this noncontrast enhanced study. There are laminated gallstones within the gallbladder. The gallbladder wall does not appear thickened. There is no appreciable biliary duct dilatation. Pancreas: There is no appreciable pancreatic mass or inflammatory focus. Fatty infiltration is noted in portions of the head of the pancreas. Spleen: No splenic lesions are evident. Adrenals/Urinary Tract: Adrenals bilaterally appear normal. The right kidney is edematous. There is a suspected parapelvic cyst on the right measuring 3.5 x 2.7 cm. There is also mild hydronephrosis on the right. There is a calculus in the lower pole of the right kidney in the lateral pelvic region measuring 1.1 x 0.9 cm. No other renal calculi are evident. There is subtle edema around the proximal right ureter. No ureteral calculus is evident. There is perinephric soft tissue thickening on the right with thickening of the mesentery in the right mid abdomen extending inferiorly as well as medial and lateral to the right kidney. No well-defined abscess seen in this area. There is a calculus at the right ureterovesical junction measuring 4 x 3 mm. There is thickening of the wall of the urinary bladder. Stomach/Bowel: There is no appreciable bowel wall or mesenteric thickening. Loops of small bowel extend into a pelvic midline ventral hernia  without demonstrable bowel compromise. There is no appreciable bowel obstruction. Terminal ileum appears normal. There is no evident free air or portal venous air. Vascular/Lymphatic: There are foci of aortic atherosclerosis. No adenopathy is evident in the abdomen or pelvis. Reproductive: Uterus is absent.  No adnexal mass lesions evident. Other: No periappendiceal region inflammation. No ascites or abscess in the abdomen pelvis. As stated above, there is a midline pelvic ventral hernia containing loops of small bowel. This hernia at its neck measures 3.8 cm from right to left dimension and 2.7 cm from superior to inferior dimension. There is a hernia along the left lower  quadrant abdominal wall which contains fat but no bowel. This hernia at its neck measures 5.1 cm from right to left dimension and 3.1 cm from superior to inferior dimension. There is a smaller midline ventral hernia near the umbilicus which contains fat but no bowel. This hernia measures 1.5 cm from right to left dimension at its neck and 2.6 cm from superior to inferior dimension at its neck. There are areas of scarring in the anterior lower abdominal and pelvic wall regions. Musculoskeletal: There is degenerative change in the lower thoracic and lumbar regions. 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 There is spinal stenosis at L2-3, L3-4, and L4-5 due to disc protrusion and bony hypertrophy. No blastic or lytic bone lesions. No intramuscular lesions are evident. IMPRESSION: 1. Mild hydronephrosis on the right. There is a 4 x 3 mm calculus at the right ureterovesical junction. The right kidney is edematous with soft tissue stranding and thickening throughout perinephric fascia on the right. Focal right renal abscess. The changes in the mesentery on the right may be due to the ureteral calculus but also could be indicative of a degree of pyelonephritis on the right. There is also a calculus in the lower pole right kidney measuring 1.1 x 0.9 cm. 2. Thickening  of the urinary bladder wall, a finding likely indicative of a degree of cystitis. 3.  Cholelithiasis.  No gallbladder wall thickening. 4. Ventral hernias as noted. There are loops of small bowel in a midline ventral pelvic hernia without bowel compromise. Other hernias containing fat but no bowel. 5. Multilevel spinal stenosis due to disc protrusion and bony hypertrophy. 6.  Aortic Atherosclerosis (ICD10-I70.0). 7.  Hepatic steatosis. 8.  Uterus absent. Electronically Signed   By: Lowella Grip III M.D.   On: 06/18/2020 09:47       ASSESSMENT AND PLAN SYNOPSIS  70 yo morbidly obese white female with acute septic shock due to e coli bacteremia with infected kidney stone s/p ureteral stent complicated by acute rena lfaiiure  Septic shock -use vasopressors to keep MAP>65  stress dose steroids midodrine -Continue IV abx  ELECTROLYTES -follow labs as needed -replace as needed -pharmacy consultation and following  ACUTE KIDNEY INJURY/Renal Failure -continue Foley Catheter-assess need -Avoid nephrotoxic agents -Follow urine output, BMP -Ensure adequate renal perfusion, optimize oxygenation -Renal dose medications BMP Latest Ref Rng & Units 06/19/2020 06/18/2020 06/17/2020  Glucose 70 - 99 mg/dL 78 96 112(H)  BUN 8 - 23 mg/dL 37(H) 34(H) 22  Creatinine 0.44 - 1.00 mg/dL 1.82(H) 2.64(H) 1.46(H)  Sodium 135 - 145 mmol/L 139 138 140  Potassium 3.5 - 5.1 mmol/L 3.7 3.9 4.0  Chloride 98 - 111 mmol/L 106 107 106  CO2 22 - 32 mmol/L 22 16(L) 23  Calcium 8.9 - 10.3 mg/dL 7.8(L) 8.2(L) 9.2    Remains SD status Will transfer to [facility] in 24 hrs  Keia Rask Patricia Pesa, M.D.  [facility]  Medical Director [place] Director [facility] Cardio-Pulmonary Department

## 2020-06-19 NOTE — Progress Notes (Signed)
A&O patient and belongings transported to room 221 via wheelchair by charge RN. Central telemetry notified. VSS.

## 2020-06-19 NOTE — Progress Notes (Signed)
1 Day Post-Op Subjective: Pain controlled. No nausea or emesis. Afebrile.  Objective: Vital signs in last 24 hours: Temp:  [98 F (36.7 C)-98.1 F (36.7 C)] 98 F (36.7 C) (02/13 0800) Pulse Rate:  [62-79] 67 (02/13 1100) Resp:  [18-30] 24 (02/13 1100) BP: (84-124)/(49-83) 108/60 (02/13 1100) SpO2:  [87 %-94 %] 90 % (02/13 1100)  Intake/Output from previous day: 02/12 0701 - 02/13 0700 In: 5433.9 [I.V.:4285.2; IV Piggyback:1148.7] Out: 1080 [Urine:1075; Blood:5] Intake/Output this shift: Total I/O In: 3 [I.V.:3] Out: -  Foley clear yellow  Physical Exam:  General: Alert and oriented CV: RRR Lungs: Clear Abdomen: Soft, ND, NT, obese Ext: NT, No erythema   Lab Results: Recent Labs    06/17/20 1338 06/18/20 0903 06/19/20 0606  HGB 15.1* 14.1 13.1  HCT 46.3* 42.6 38.9   BMET Recent Labs    06/18/20 0903 06/19/20 0606  NA 138 139  K 3.9 3.7  CL 107 106  CO2 16* 22  GLUCOSE 96 78  BUN 34* 37*  CREATININE 2.64* 1.82*  CALCIUM 8.2* 7.8*     Studies/Results: DG Chest Port 1 View  Result Date: 06/19/2020 CLINICAL DATA:  Assess for interval change. Admitted patient with acute pyelonephritis. EXAM: PORTABLE CHEST 1 VIEW COMPARISON:  Radiograph 06/17/2020 FINDINGS: Stable lung volumes. Stable cardiomegaly. Unchanged mediastinal contours. No focal airspace disease. No pulmonary edema. No pleural fluid or pneumothorax. No acute osseous abnormalities are seen. IMPRESSION: No significant interval change.  Stable cardiomegaly. Electronically Signed   By: Keith Rake M.D.   On: 06/19/2020 03:47   DG OR UROLOGY CYSTO IMAGE ([place])  Result Date: 06/18/2020 There is no interpretation for this exam.  This order is for images obtained during a surgical procedure.  Please See "Surgeries" Tab for more information regarding the procedure.   CT Renal Stone Study  Result Date: 06/18/2020 CLINICAL DATA:  Abdominal pain with nausea and vomiting EXAM: CT ABDOMEN AND  PELVIS WITHOUT CONTRAST TECHNIQUE: Multidetector CT imaging of the abdomen and pelvis was performed following the standard protocol without oral or IV contrast. COMPARISON:  None. FINDINGS: Lower chest: Lung bases are clear. Hepatobiliary: There is hepatic steatosis. No focal liver lesions are evident on this noncontrast enhanced study. There are laminated gallstones within the gallbladder. The gallbladder wall does not appear thickened. There is no appreciable biliary duct dilatation. Pancreas: There is no appreciable pancreatic mass or inflammatory focus. Fatty infiltration is noted in portions of the head of the pancreas. Spleen: No splenic lesions are evident. Adrenals/Urinary Tract: Adrenals bilaterally appear normal. The right kidney is edematous. There is a suspected parapelvic cyst on the right measuring 3.5 x 2.7 cm. There is also mild hydronephrosis on the right. There is a calculus in the lower pole of the right kidney in the lateral pelvic region measuring 1.1 x 0.9 cm. No other renal calculi are evident. There is subtle edema around the proximal right ureter. No ureteral calculus is evident. There is perinephric soft tissue thickening on the right with thickening of the mesentery in the right mid abdomen extending inferiorly as well as medial and lateral to the right kidney. No well-defined abscess seen in this area. There is a calculus at the right ureterovesical junction measuring 4 x 3 mm. There is thickening of the wall of the urinary bladder. Stomach/Bowel: There is no appreciable bowel wall or mesenteric thickening. Loops of small bowel extend into a pelvic midline ventral hernia without demonstrable bowel compromise. There is no appreciable bowel obstruction.  Terminal ileum appears normal. There is no evident free air or portal venous air. Vascular/Lymphatic: There are foci of aortic atherosclerosis. No adenopathy is evident in the abdomen or pelvis. Reproductive: Uterus is absent.  No adnexal  mass lesions evident. Other: No periappendiceal region inflammation. No ascites or abscess in the abdomen pelvis. As stated above, there is a midline pelvic ventral hernia containing loops of small bowel. This hernia at its neck measures 3.8 cm from right to left dimension and 2.7 cm from superior to inferior dimension. There is a hernia along the left lower quadrant abdominal wall which contains fat but no bowel. This hernia at its neck measures 5.1 cm from right to left dimension and 3.1 cm from superior to inferior dimension. There is a smaller midline ventral hernia near the umbilicus which contains fat but no bowel. This hernia measures 1.5 cm from right to left dimension at its neck and 2.6 cm from superior to inferior dimension at its neck. There are areas of scarring in the anterior lower abdominal and pelvic wall regions. Musculoskeletal: There is degenerative change in the lower thoracic and lumbar regions. 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 There is spinal stenosis at L2-3, L3-4, and L4-5 due to disc protrusion and bony hypertrophy. No blastic or lytic bone lesions. No intramuscular lesions are evident. IMPRESSION: 1. Mild hydronephrosis on the right. There is a 4 x 3 mm calculus at the right ureterovesical junction. The right kidney is edematous with soft tissue stranding and thickening throughout perinephric fascia on the right. Focal right renal abscess. The changes in the mesentery on the right may be due to the ureteral calculus but also could be indicative of a degree of pyelonephritis on the right. There is also a calculus in the lower pole right kidney measuring 1.1 x 0.9 cm. 2. Thickening of the urinary bladder wall, a finding likely indicative of a degree of cystitis. 3.  Cholelithiasis.  No gallbladder wall thickening. 4. Ventral hernias as noted. There are loops of small bowel in a midline ventral pelvic hernia without bowel compromise. Other hernias containing fat but no bowel. 5. 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 6.  Aortic Atherosclerosis (ICD10-I70.0). 7.  Hepatic steatosis. 8.  Uterus absent. Electronically Signed   By: Lowella Grip III M.D.   On: 06/18/2020 09:47    Assessment/Plan: 1. Obstructing RIGHT ureteral stone with sepsis: CT A/P 06/18/20 with 64mm R UVJ stone with obstruction and sepsis. S/p cysto, R stent on 06/18/2020.  2. Septic shock due to obstructing stone and subsequent UTI 3. Bacteremia 4. AKI  -UCx 06/17/2020 with >100K E coli -Blood cx 06/17/2020 with E coli -Continue rocephin per primary. Will need to complete at least 1 week outpatient PO abx therapy. -Will need f/u to address treatment of her right ureteral stone and 1.1cm R lower pole stone.  -Void trial tomorrow as long as afebrile.   LOS: 1 day   Matt R. Cherylyn Sundby MD 06/19/2020, 3:58 PM [facility]  Pager: [phone]

## 2020-06-19 NOTE — H&P (Signed)
Name: Heather Simmons MRN: [id number] DOB: 12/01/1950     CONSULTATION DATE: 06/19/2020  REFERRING MD : [place]  HISTORY OF PRESENT ILLNESS: 70 y.o. female with past medical history of hypertension, gout, and migraines who presents to the ED complaining of abnormal labs.    Patient was seen in the ED yesterday for 24 hours of generalized weakness, urinary frequency, and lower abdominal discomfort.    + diagnosed with a UTI at that time and started on cefdinir.   She states that she had a hard time sleeping last night and developed chills this morning  felt like she was feverish but has not taken her temperature.   Pain has also extended into her right flank.    She describes this pain as sharp and constant, not exacerbated or alleviated by anything.    She felt nauseous overnight, but has not vomited.  She received a call this morning that her blood cultures were growing gram-negative rods and that she needed to come back to the hospital.  ER source patient with severe hypotension started ABX Placed on pressors UROLOGY consulted Procedure: Cystoscopy with insertion of right double-J stent. Preop diagnosis: 4 mm right distal ureteral stone with obstruction and sepsis. 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  CC Follow up septic shock  HPI Alert and awake Weaning off pressors  PAST MEDICAL HISTORY :   has a past medical history of Anginal pain (HCC), Complication of anesthesia, Fluid collection (edema) in the arms, legs, hands and feet, Gout, Headache, Heart murmur, and Palpitations.  has a past surgical history that includes Abdominal hysterectomy; Cataract extraction w/PHACO (Right, 10/04/2015); Cataract extraction w/PHACO (Left, 11/01/2015); and Oophorectomy. Prior to Admission medications   Medication Sig Start Date End Date Taking? Authorizing Provider  allopurinol (ZYLOPRIM) 100 MG tablet Take 100 mg by mouth daily.   Yes [provider]  amLODipine (NORVASC) 10 MG tablet Take 10 mg by mouth at bedtime. HS   Yes [provider]  carvedilol (COREG) 6.25 MG tablet Take 6.25 mg by mouth 2 (two) times daily with a meal.   Yes [provider]  cefdinir (OMNICEF) 300 MG capsule Take 1 capsule (300 mg total) by mouth 2 (two) times daily for 5 days. 06/17/20 06/22/20 Yes Bradler, Evan K, MD  losartan (COZAAR) 100 MG tablet Take 100 mg by mouth at bedtime.    Yes [provider]  ondansetron (ZOFRAN ODT) 4 MG disintegrating tablet Take 1 tablet (4 mg total) by mouth every 8 (eight) hours as needed for nausea or vomiting. 06/17/20  Yes Bradler, Evan K, MD  topiramate (TOPAMAX) 25 MG tablet Take 25 mg by mouth at bedtime. HS   Yes [provider]   Allergies  Allergen Reactions  . Lisinopril Other (See Comments)    Intolerance - HAs    FAMILY HISTORY:  family history includes Breast cancer (age of onset: 40) in her mother and sister. SOCIAL HISTORY:  reports that she has never smoked. She has never used smokeless tobacco. She reports that she does not drink alcohol.    Review of Systems:  Gen:  Denies  fever, sweats, chills weight loss  HEENT: Denies blurred vision, double vision, ear pain, eye pain, hearing loss, nose bleeds, sore throat Cardiac:  No dizziness, chest pain or heaviness, chest tightness,edema, No JVD Resp:   No cough, -sputum production, -shortness of breath,-wheezing, -hemoptysis,  Gi: Denies swallowing difficulty, stomach pain, nausea or vomiting, diarrhea, constipation, bowel   bowel incontinence Gu:  Denies bladder incontinence, burning urine Ext:   Denies Joint pain, stiffness or swelling Skin: Denies  skin rash, easy bruising or bleeding or hives Endoc:  Denies polyuria, polydipsia , polyphagia or weight change Psych:   Denies depression, insomnia or hallucinations  Other:  All other systems negative      VITAL SIGNS: Temp:  [98 F (36.7 C)-99.1 F  (37.3 C)] 98.1 F (36.7 C) (02/13 0349) Pulse Rate:  [62-119] 67 (02/13 0700) Resp:  [18-30] 18 (02/13 0700) BP: (64-124)/(34-83) 96/55 (02/13 0700) SpO2:  [86 %-96 %] 93 % (02/13 0700) Weight:  [125 kg-127.8 kg] 127.8 kg (02/12 1330)     SpO2: 93 % O2 Flow Rate (L/min): 2.5 L/min    Physical Examination:   General Appearance: No distress  Neuro:without focal findings,  speech normal,  HEENT: PERRLA, EOM intact.   Pulmonary: normal breath sounds, No wheezing.  CardiovascularNormal S1,S2.  No m/r/g.   Abdomen: Benign, Soft, non-tender. Extremities: normal, no cyanosis, clubbing. PSYCHIATRIC: Mood, affect within normal limits.   ALL OTHER ROS ARE NEGATIVE    MEDICATIONS: I have reviewed all medications and confirmed regimen as documented    CULTURE RESULTS   Recent Results (from the past 240 hour(s))  Culture, blood (Routine x 2)     Status: None (Preliminary result)   Collection Time: 06/17/20  1:38 PM   Specimen: BLOOD  Result Value Ref Range Status   Specimen Description BLOOD RIGHT ANTECUBITAL  Final   Special Requests   Final    BOTTLES DRAWN AEROBIC AND ANAEROBIC Blood Culture adequate volume   Culture  Setup Time   Final    GRAM NEGATIVE RODS IN BOTH AEROBIC AND ANAEROBIC BOTTLES CRITICAL RESULT CALLED TO, READ BACK BY AND VERIFIED WITH: ANGELA ROBBINS AT [place] 06/18/20.PMF Performed at [facility], [address] [place], [place] [address]    Culture GRAM NEGATIVE RODS  Final   Report Status PENDING  Incomplete  Culture, blood (Routine x 2)     Status: None (Preliminary result)   Collection Time: 06/17/20  1:42 PM   Specimen: BLOOD  Result Value Ref Range Status   Specimen Description BLOOD BLOOD LEFT WRIST  Final   Special Requests   Final    BOTTLES DRAWN AEROBIC AND ANAEROBIC Blood Culture results may not be optimal due to an inadequate volume of blood received in culture bottles   Culture  Setup Time   Final    Organism ID to  follow GRAM NEGATIVE RODS IN BOTH AEROBIC AND ANAEROBIC BOTTLES CRITICAL RESULT CALLED TO, READ BACK BY AND VERIFIED WITH: ANGELA ROBBINS AT [place] 06/18/20.PMF Performed at [facility], [address] [place], [place] [address]    Culture GRAM NEGATIVE RODS  Final   Report Status PENDING  Incomplete  Blood Culture ID Panel (Reflexed)     Status: Abnormal   Collection Time: 06/17/20  1:42 PM  Result Value Ref Range Status   Enterococcus faecalis NOT DETECTED NOT DETECTED Final   Enterococcus Faecium NOT DETECTED NOT DETECTED Final   Listeria monocytogenes NOT DETECTED NOT DETECTED Final   Staphylococcus species NOT DETECTED NOT DETECTED Final   Staphylococcus aureus (BCID) NOT DETECTED NOT DETECTED Final   Staphylococcus epidermidis NOT DETECTED NOT DETECTED Final   Staphylococcus lugdunensis NOT DETECTED NOT DETECTED Final   Streptococcus species NOT DETECTED NOT DETECTED Final   Streptococcus agalactiae NOT DETECTED NOT DETECTED Final   Streptococcus pneumoniae NOT DETECTED NOT DETECTED Final  Streptococcus pyogenes NOT DETECTED NOT DETECTED Final   A.calcoaceticus-baumannii NOT DETECTED NOT DETECTED Final   Bacteroides fragilis NOT DETECTED NOT DETECTED Final   Enterobacterales DETECTED (A) NOT DETECTED Final    Comment: Enterobacterales represent a large order of gram negative bacteria, not a single organism. CRITICAL RESULT CALLED TO, READ BACK BY AND VERIFIED WITH: ANGELA ROBBINS AT [id number] 06/18/20.PMF    Enterobacter cloacae complex NOT DETECTED NOT DETECTED Final   Escherichia coli DETECTED (A) NOT DETECTED Final    Comment: CRITICAL RESULT CALLED TO, READ BACK BY AND VERIFIED WITH: ANGELA ROBBINS AT 9983 06/18/20.PMF    Klebsiella aerogenes NOT DETECTED NOT DETECTED Final   Klebsiella oxytoca NOT DETECTED NOT DETECTED Final   Klebsiella pneumoniae NOT DETECTED NOT DETECTED Final   Proteus species NOT DETECTED NOT DETECTED Final   Salmonella species NOT DETECTED  NOT DETECTED Final   Serratia marcescens NOT DETECTED NOT DETECTED Final   Haemophilus influenzae NOT DETECTED NOT DETECTED Final   Neisseria meningitidis NOT DETECTED NOT DETECTED Final   Pseudomonas aeruginosa NOT DETECTED NOT DETECTED Final   Stenotrophomonas maltophilia NOT DETECTED NOT DETECTED Final   Candida albicans NOT DETECTED NOT DETECTED Final   Candida auris NOT DETECTED NOT DETECTED Final   Candida glabrata NOT DETECTED NOT DETECTED Final   Candida krusei NOT DETECTED NOT DETECTED Final   Candida parapsilosis NOT DETECTED NOT DETECTED Final   Candida tropicalis NOT DETECTED NOT DETECTED Final   Cryptococcus neoformans/gattii NOT DETECTED NOT DETECTED Final   CTX-M ESBL NOT DETECTED NOT DETECTED Final   Carbapenem resistance IMP NOT DETECTED NOT DETECTED Final   Carbapenem resistance KPC NOT DETECTED NOT DETECTED Final   Carbapenem resistance NDM NOT DETECTED NOT DETECTED Final   Carbapenem resist OXA 48 LIKE NOT DETECTED NOT DETECTED Final   Carbapenem resistance VIM NOT DETECTED NOT DETECTED Final    Comment: Performed at [facility], [address] [place], [place] [address]  Resp Panel by RT-PCR (Flu A&B, Covid) Nasopharyngeal Swab     Status: None   Collection Time: 06/17/20  2:54 PM   Specimen: Nasopharyngeal Swab; Nasopharyngeal(NP) swabs in vial transport medium  Result Value Ref Range Status   SARS Coronavirus 2 by RT PCR NEGATIVE NEGATIVE Final    Comment: (NOTE) SARS-CoV-2 target nucleic acids are NOT DETECTED.  The SARS-CoV-2 RNA is generally detectable in upper respiratory specimens during the acute phase of infection. The lowest concentration of SARS-CoV-2 viral copies this assay can detect is 138 copies/mL. A negative result does not preclude SARS-Cov-2 infection and should not be used as the sole basis for treatment or other patient management decisions. A negative result may occur with  improper specimen collection/handling, submission of  specimen other than nasopharyngeal swab, presence of viral mutation(s) within the areas targeted by this assay, and inadequate number of viral copies(<138 copies/mL). A negative result must be combined with clinical observations, patient history, and epidemiological information. The expected result is Negative.  Fact Sheet for Patients:  EntrepreneurPulse.com.au  Fact Sheet for Healthcare Providers:  IncredibleEmployment.be  This test is no t yet approved or cleared by the [place] FDA and  has been authorized for detection and/or diagnosis of SARS-CoV-2 by FDA under an Emergency Use Authorization (EUA). This EUA will remain  in effect (meaning this test can be used) for the duration of the COVID-19 declaration under Section 564(b)(1) of the Act, 21 U.S.C.section 360bbb-3(b)(1), unless the authorization is terminated  or revoked sooner.  Influenza A by PCR NEGATIVE NEGATIVE Final   Influenza B by PCR NEGATIVE NEGATIVE Final    Comment: (NOTE) The Xpert Xpress SARS-CoV-2/FLU/RSV plus assay is intended as an aid in the diagnosis of influenza from Nasopharyngeal swab specimens and should not be used as a sole basis for treatment. Nasal washings and aspirates are unacceptable for Xpert Xpress SARS-CoV-2/FLU/RSV testing.  Fact Sheet for Patients: EntrepreneurPulse.com.au  Fact Sheet for Healthcare Providers: IncredibleEmployment.be  This test is not yet approved or cleared by the [place] FDA and has been authorized for detection and/or diagnosis of SARS-CoV-2 by FDA under an Emergency Use Authorization (EUA). This EUA will remain in effect (meaning this test can be used) for the duration of the COVID-19 declaration under Section 564(b)(1) of the Act, 21 U.S.C. section 360bbb-3(b)(1), unless the authorization is terminated or revoked.  Performed at [facility], [address] [place], [place] [address]   Blood culture (single)     Status: None (Preliminary result)   Collection Time: 06/18/20  9:03 AM   Specimen: BLOOD LEFT WRIST  Result Value Ref Range Status   Specimen Description BLOOD LEFT WRIST  Final   Special Requests   Final    BOTTLES DRAWN AEROBIC AND ANAEROBIC Blood Culture adequate volume   Culture   Final    NO GROWTH < 24 HOURS Performed at [facility], [address] [place], [place] [address]    Report Status PENDING  Incomplete  Resp Panel by RT-PCR (Flu A&B, Covid) Nasopharyngeal Swab     Status: None   Collection Time: 06/18/20  9:55 AM   Specimen: Nasopharyngeal Swab; Nasopharyngeal(NP) swabs in vial transport medium  Result Value Ref Range Status   SARS Coronavirus 2 by RT PCR NEGATIVE NEGATIVE Final    Comment: (NOTE) SARS-CoV-2 target nucleic acids are NOT DETECTED.  The SARS-CoV-2 RNA is generally detectable in upper respiratory specimens during the acute phase of infection. The lowest concentration of SARS-CoV-2 viral copies this assay can detect is 138 copies/mL. A negative result does not preclude SARS-Cov-2 infection and should not be used as the sole basis for treatment or other patient management decisions. A negative result may occur with  improper specimen collection/handling, submission of specimen other than nasopharyngeal swab, presence of viral mutation(s) within the areas targeted by this assay, and inadequate number of viral copies(<138 copies/mL). A negative result must be combined with clinical observations, patient history, and epidemiological information. The expected result is Negative.  Fact Sheet for Patients:  EntrepreneurPulse.com.au  Fact Sheet for Healthcare Providers:  IncredibleEmployment.be  This test is no t yet approved or cleared by the [place] FDA and  has been authorized for detection and/or diagnosis of SARS-CoV-2 by FDA under an  Emergency Use Authorization (EUA). This EUA will remain  in effect (meaning this test can be used) for the duration of the COVID-19 declaration under Section 564(b)(1) of the Act, 21 U.S.C.section 360bbb-3(b)(1), unless the authorization is terminated  or revoked sooner.       Influenza A by PCR NEGATIVE NEGATIVE Final   Influenza B by PCR NEGATIVE NEGATIVE Final    Comment: (NOTE) The Xpert Xpress SARS-CoV-2/FLU/RSV plus assay is intended as an aid in the diagnosis of influenza from Nasopharyngeal swab specimens and should not be used as a sole basis for treatment. Nasal washings and aspirates are unacceptable for Xpert Xpress SARS-CoV-2/FLU/RSV testing.  Fact Sheet for Patients: EntrepreneurPulse.com.au  Fact Sheet for Healthcare Providers: IncredibleEmployment.be  This test is  not yet approved or cleared by the [place] and has been authorized for detection and/or diagnosis of SARS-CoV-2 by FDA under an Emergency Use Authorization (EUA). This EUA will remain in effect (meaning this test can be used) for the duration of the COVID-19 declaration under Section 564(b)(1) of the Act, 21 U.S.C. section 360bbb-3(b)(1), unless the authorization is terminated or revoked.  Performed at [facility], [address] [place], [place] [address]           IMAGING    DG Chest Port 1 View  Result Date: 06/19/2020 CLINICAL DATA:  Assess for interval change. Admitted patient with acute pyelonephritis. EXAM: PORTABLE CHEST 1 VIEW COMPARISON:  Radiograph 06/17/2020 FINDINGS: Stable lung volumes. Stable cardiomegaly. Unchanged mediastinal contours. No focal airspace disease. No pulmonary edema. No pleural fluid or pneumothorax. No acute osseous abnormalities are seen. IMPRESSION: No significant interval change.  Stable cardiomegaly. Electronically Signed   By: Keith Rake M.D.   On: 06/19/2020 03:47   DG OR UROLOGY CYSTO IMAGE  ([place])  Result Date: 06/18/2020 There is no interpretation for this exam.  This order is for images obtained during a surgical procedure.  Please See "Surgeries" Tab for more information regarding the procedure.   CT Renal Stone Study  Result Date: 06/18/2020 CLINICAL DATA:  Abdominal pain with nausea and vomiting EXAM: CT ABDOMEN AND PELVIS WITHOUT CONTRAST TECHNIQUE: Multidetector CT imaging of the abdomen and pelvis was performed following the standard protocol without oral or IV contrast. COMPARISON:  None. FINDINGS: Lower chest: Lung bases are clear. Hepatobiliary: There is hepatic steatosis. No focal liver lesions are evident on this noncontrast enhanced study. There are laminated gallstones within the gallbladder. The gallbladder wall does not appear thickened. There is no appreciable biliary duct dilatation. Pancreas: There is no appreciable pancreatic mass or inflammatory focus. Fatty infiltration is noted in portions of the head of the pancreas. Spleen: No splenic lesions are evident. Adrenals/Urinary Tract: Adrenals bilaterally appear normal. The right kidney is edematous. There is a suspected parapelvic cyst on the right measuring 3.5 x 2.7 cm. There is also mild hydronephrosis on the right. There is a calculus in the lower pole of the right kidney in the lateral pelvic region measuring 1.1 x 0.9 cm. No other renal calculi are evident. There is subtle edema around the proximal right ureter. No ureteral calculus is evident. There is perinephric soft tissue thickening on the right with thickening of the mesentery in the right mid abdomen extending inferiorly as well as medial and lateral to the right kidney. No well-defined abscess seen in this area. There is a calculus at the right ureterovesical junction measuring 4 x 3 mm. There is thickening of the wall of the urinary bladder. Stomach/Bowel: There is no appreciable bowel wall or mesenteric thickening. Loops of small bowel extend into a  pelvic midline ventral hernia without demonstrable bowel compromise. There is no appreciable bowel obstruction. Terminal ileum appears normal. There is no evident free air or portal venous air. Vascular/Lymphatic: There are foci of aortic atherosclerosis. No adenopathy is evident in the abdomen or pelvis. Reproductive: Uterus is absent.  No adnexal mass lesions evident. Other: No periappendiceal region inflammation. No ascites or abscess in the abdomen pelvis. As stated above, there is a midline pelvic ventral hernia containing loops of small bowel. This hernia at its neck measures 3.8 cm from right to left dimension and 2.7 cm from superior to inferior dimension. There is a hernia along the left lower  quadrant abdominal wall which contains fat but no bowel. This hernia at its neck measures 5.1 cm from right to left dimension and 3.1 cm from superior to inferior dimension. There is a smaller midline ventral hernia near the umbilicus which contains fat but no bowel. This hernia measures 1.5 cm from right to left dimension at its neck and 2.6 cm from superior to inferior dimension at its neck. There are areas of scarring in the anterior lower abdominal and pelvic wall regions. Musculoskeletal: There is degenerative change in the lower thoracic and lumbar regions. 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 There is spinal stenosis at L2-3, L3-4, and L4-5 due to disc protrusion and bony hypertrophy. No blastic or lytic bone lesions. No intramuscular lesions are evident. IMPRESSION: 1. Mild hydronephrosis on the right. There is a 4 x 3 mm calculus at the right ureterovesical junction. The right kidney is edematous with soft tissue stranding and thickening throughout perinephric fascia on the right. Focal right renal abscess. The changes in the mesentery on the right may be due to the ureteral calculus but also could be indicative of a degree of pyelonephritis on the right. There is also a calculus in the lower pole right kidney  measuring 1.1 x 0.9 cm. 2. Thickening of the urinary bladder wall, a finding likely indicative of a degree of cystitis. 3.  Cholelithiasis.  No gallbladder wall thickening. 4. Ventral hernias as noted. There are loops of small bowel in a midline ventral pelvic hernia without bowel compromise. Other hernias containing fat but no bowel. 5. Multilevel spinal stenosis due to disc protrusion and bony hypertrophy. 6.  Aortic Atherosclerosis (ICD10-I70.0). 7.  Hepatic steatosis. 8.  Uterus absent. Electronically Signed   By: Lowella Grip III M.D.   On: 06/18/2020 09:47       ASSESSMENT AND PLAN SYNOPSIS  70 yo morbidly obese white female with acute septic shock due to e coli bacteremia with infected kidney stone s/p ureteral stent complicated by acute rena lfaiiure  Septic shock -use vasopressors to keep MAP>65  stress dose steroids midodrine -Continue IV abx  ELECTROLYTES -follow labs as needed -replace as needed -pharmacy consultation and following  ACUTE KIDNEY INJURY/Renal Failure -continue Foley Catheter-assess need -Avoid nephrotoxic agents -Follow urine output, BMP -Ensure adequate renal perfusion, optimize oxygenation -Renal dose medications BMP Latest Ref Rng & Units 06/19/2020 06/18/2020 06/17/2020  Glucose 70 - 99 mg/dL 78 96 112(H)  BUN 8 - 23 mg/dL 37(H) 34(H) 22  Creatinine 0.44 - 1.00 mg/dL 1.82(H) 2.64(H) 1.46(H)  Sodium 135 - 145 mmol/L 139 138 140  Potassium 3.5 - 5.1 mmol/L 3.7 3.9 4.0  Chloride 98 - 111 mmol/L 106 107 106  CO2 22 - 32 mmol/L 22 16(L) 23  Calcium 8.9 - 10.3 mg/dL 7.8(L) 8.2(L) 9.2    Remains SD status Will transfer to [facility] in 24 hrs  Merle Cirelli Patricia Pesa, M.D.  [facility]  Medical Director [place] Director [facility] Cardio-Pulmonary Department

## 2020-06-20 ENCOUNTER — Encounter: Payer: Self-pay | Admitting: Urology

## 2020-06-20 DIAGNOSIS — N10 Acute tubulo-interstitial nephritis: Secondary | ICD-10-CM | POA: Diagnosis not present

## 2020-06-20 DIAGNOSIS — N201 Calculus of ureter: Secondary | ICD-10-CM

## 2020-06-20 LAB — URINE CULTURE

## 2020-06-20 LAB — RENAL FUNCTION PANEL
Albumin: 2.6 g/dL — ABNORMAL LOW (ref 3.5–5.0)
Anion gap: 7 (ref 5–15)
BUN: 40 mg/dL — ABNORMAL HIGH (ref 8–23)
CO2: 23 mmol/L (ref 22–32)
Calcium: 8.2 mg/dL — ABNORMAL LOW (ref 8.9–10.3)
Chloride: 109 mmol/L (ref 98–111)
Creatinine, Ser: 1.58 mg/dL — ABNORMAL HIGH (ref 0.44–1.00)
GFR, Estimated: 35 mL/min — ABNORMAL LOW (ref >60)
Glucose, Bld: 98 mg/dL (ref 70–99)
Phosphorus: 2.9 mg/dL (ref 2.5–4.6)
Potassium: 3.6 mmol/L (ref 3.5–5.1)
Sodium: 139 mmol/L (ref 135–145)

## 2020-06-20 LAB — CBC WITH DIFFERENTIAL/PLATELET
Abs Immature Granulocytes: 9.29 10*3/uL — ABNORMAL HIGH (ref 0.00–0.07)
Basophils Absolute: 0 10*3/uL (ref 0.0–0.1)
Basophils Relative: 0 %
Eosinophils Absolute: 0 10*3/uL (ref 0.0–0.5)
Eosinophils Relative: 0 %
HCT: 37.7 % (ref 36.0–46.0)
Hemoglobin: 12.7 g/dL (ref 12.0–15.0)
Immature Granulocytes: 25 %
Lymphocytes Relative: 6 %
Lymphs Abs: 2.1 10*3/uL (ref 0.7–4.0)
MCH: 32 pg (ref 26.0–34.0)
MCHC: 33.7 g/dL (ref 30.0–36.0)
MCV: 95 fL (ref 80.0–100.0)
Monocytes Absolute: 0.9 10*3/uL (ref 0.1–1.0)
Monocytes Relative: 2 %
Neutro Abs: 25.4 10*3/uL — ABNORMAL HIGH (ref 1.7–7.7)
Neutrophils Relative %: 67 %
Platelets: 105 10*3/uL — ABNORMAL LOW (ref 150–400)
RBC: 3.97 MIL/uL (ref 3.87–5.11)
RDW: 13 % (ref 11.5–15.5)
Smear Review: NORMAL
WBC: 37.7 10*3/uL — ABNORMAL HIGH (ref 4.0–10.5)
nRBC: 0 % (ref 0.0–0.2)

## 2020-06-20 LAB — MAGNESIUM: Magnesium: 2.4 mg/dL (ref 1.7–2.4)

## 2020-06-20 LAB — CULTURE, BLOOD (ROUTINE X 2): Special Requests: ADEQUATE

## 2020-06-20 MED ORDER — HYDROCORTISONE NA SUCCINATE PF 250 MG IJ SOLR
50.0000 mg | Freq: Three times a day (TID) | INTRAMUSCULAR | Status: DC
  Administered 2020-06-20 – 2020-06-21 (×3): 50 mg via INTRAVENOUS
  Filled 2020-06-20 (×4): qty 50

## 2020-06-20 MED ORDER — FAMOTIDINE 20 MG PO TABS
20.0000 mg | ORAL_TABLET | Freq: Two times a day (BID) | ORAL | Status: DC
  Administered 2020-06-20 – 2020-06-22 (×5): 20 mg via ORAL
  Filled 2020-06-20 (×5): qty 1

## 2020-06-20 MED ORDER — HYDROCORTISONE NA SUCCINATE PF 100 MG IJ SOLR
50.0000 mg | Freq: Three times a day (TID) | INTRAMUSCULAR | Status: DC
  Filled 2020-06-20 (×3): qty 1

## 2020-06-20 MED ORDER — MIDODRINE HCL 5 MG PO TABS
5.0000 mg | ORAL_TABLET | Freq: Three times a day (TID) | ORAL | Status: DC
  Administered 2020-06-20 – 2020-06-22 (×4): 5 mg via ORAL
  Filled 2020-06-20 (×4): qty 1

## 2020-06-20 MED ORDER — TOPIRAMATE 25 MG PO TABS
25.0000 mg | ORAL_TABLET | Freq: Every day | ORAL | Status: DC
  Administered 2020-06-20 – 2020-06-21 (×2): 25 mg via ORAL
  Filled 2020-06-20 (×3): qty 1

## 2020-06-20 MED ORDER — ALLOPURINOL 100 MG PO TABS
100.0000 mg | ORAL_TABLET | Freq: Every day | ORAL | Status: DC
  Administered 2020-06-20 – 2020-06-22 (×3): 100 mg via ORAL
  Filled 2020-06-20 (×3): qty 1

## 2020-06-20 NOTE — Evaluation (Signed)
Occupational Therapy Evaluation Patient Details Name: Heather Simmons MRN: [id number] DOB: 20-Mar-1951 Today's Date: 06/20/2020    History of Present Illness 70 y.o. female with past medical history of hypertension, gout, and migraines who presented to the ED complaining of R flank pain & abnormal labs. Patient found to have UTI and obstructing right ureteral stone. Now, s/p cystoscopy with right ureteral stent placement 06/18/2020.  Shock physiology improving.  Patient transferred out of ICU to [facility] service on 06/20/2020.   Clinical Impression   Pt seen for OT evaluation this date. Upon arrival to room, pt using toilet in bathroom with nursing. Pt agreeable to evaluation and reporting no pain. Pt presents with decreased balance, strength, and activity tolerance, and currently requires SUPERVISION for sit<>stand transfers, MIN GUARD for sit>stand toilet hygiene, SUPERVISION for washing/drying hands while standing sink-side, and MIN GUARD-SUPERVISION for functional mobility with RW. Mild SOB with exertion; desat to 87% following functional mobility and toileting, quickly recovering to >90% within 20-30 seconds of seated rest and cues for PLB. Of note, pt required MIN verbal cues for activity pacing and safety awareness (i.e., keeping walker within BOS throughout functional activities). Pt also educated on energy conservation strategies including pursed lip breathing, activity pacing, and DME. Pt verbalized understanding and would benefit from additional skilled OT services to maximize recall and carryover of learned techniques and facilitate implementation of learned techniques into daily routines. Upon discharge, recommend assistance/supervision from family PRN.      Follow Up Recommendations  No OT follow up;Supervision - Intermittent    Equipment Recommendations  None recommended by OT       Precautions / Restrictions Precautions Precautions: Fall Restrictions Weight Bearing Restrictions: No       Mobility Bed Mobility Overal bed mobility: Needs Assistance Bed Mobility: Supine to Sit     Supine to sit: Min assist     General bed mobility comments: Pt received on toilet and left sitting on EOB with nursing    Transfers Overall transfer level: Needs assistance Equipment used: Rolling walker (2 wheeled) Transfers: Sit to/from Stand Sit to Stand: Supervision         General transfer comment: Able to perform x3 sit<>stand transfers, requiring only SUPERVISION and MIN verbal cues for hand placement to ensure controlled descent stand>sit    Balance Overall balance assessment: Needs assistance Sitting-balance support: No upper extremity supported;Feet supported Sitting balance-Leahy Scale: Good Sitting balance - Comments: Able to reach beyond BOS for items on tray while sitting EOB   Standing balance support: Single extremity supported;During functional activity Standing balance-Leahy Scale: Fair Standing balance comment: Able to perform anterior peri-care, requiring MIN GUARD and unilateral UE support on RW                           ADL either performed or assessed with clinical judgement   ADL Overall ADL's : Needs assistance/impaired     Grooming: Wash/dry hands;Supervision/safety;Standing                   Toilet Transfer: Supervision/safety;Ambulation;Regular Toilet;Grab bars;RW   Toileting- Water quality scientist and Hygiene: Supervision/safety;Sit to/from stand       Functional mobility during ADLs: Min guard;Supervision/safety;Rolling walker General ADL Comments: Able to participate in toileting, standing hand hygiene, and functional mobility of ~120' with RW, requiring MIN verbal cues for activity pacing and safety awareness (i.e., keeping walker within BOS throughout functional activities)  Pertinent Vitals/Pain Pain Assessment: No/denies pain        Extremity/Trunk Assessment Upper Extremity Assessment Upper  Extremity Assessment: Overall WFL for tasks assessed   Lower Extremity Assessment Lower Extremity Assessment: Defer to PT evaluation   Cervical / Trunk Assessment Cervical / Trunk Assessment: Normal   Communication Communication Communication: No difficulties   Cognition Arousal/Alertness: Awake/alert Behavior During Therapy: WFL for tasks assessed/performed Overall Cognitive Status: Within Functional Limits for tasks assessed                                 General Comments: Pleasant and agreeable throughout. Mildly hyperverbal at times, requiring MIN redirection to task at hand.   General Comments  Mild SOB with exertion; desat to 87% following functional mobility and toileting, quickly recovering to >90% within 20-30 seconds of seated rest and cue for PLB    Exercises Pt educated on energy conservation strategies including pursed lip breathing, activity pacing, and DME. Pt verbalized understanding.       Home Living Family/patient expects to be discharged to:: Private residence Living Arrangements: Alone Available Help at Discharge: Family;Available PRN/intermittently Type of Home: House Home Access: Stairs to enter [employer] of Steps: 3+1 Entrance Stairs-Rails: Right;Left;Can reach both Home Layout: One level     Bathroom Shower/Tub: Teacher, early years/pre: Standard     Home Equipment: Shower seat          Prior Functioning/Environment Level of Independence: Independent        Comments: Indep with ADLs, household and community mobilization without assist device; + driving, short-distances during the day; sisters/family assist with iADLs, community needs as needed        OT Problem List: Decreased strength;Decreased activity tolerance;Impaired balance (sitting and/or standing);Decreased cognition;Decreased safety awareness      OT Treatment/Interventions: Self-care/ADL training;Therapeutic exercise;Energy  conservation;Therapeutic activities;DME and/or AE instruction;Patient/family education;Balance training    OT Goals(Current goals can be found in the care plan section) Acute Rehab OT Goals Patient Stated Goal: to return home OT Goal Formulation: With patient Time For Goal Achievement: 07/04/20 Potential to Achieve Goals: Good ADL Goals Pt Will Perform Grooming: with modified independence;standing Pt Will Perform Toileting - Clothing Manipulation and hygiene: with modified independence;sit to/from stand Pt Will Perform Tub/Shower Transfer: Shower transfer;with modified independence;ambulating;shower seat;rolling walker  OT Frequency: Min 1X/week    AM-PAC OT "6 Clicks" Daily Activity     Outcome Measure Help from another person eating meals?: None Help from another person taking care of personal grooming?: A Little Help from another person toileting, which includes using toliet, bedpan, or urinal?: A Little Help from another person bathing (including washing, rinsing, drying)?: A Little Help from another person to put on and taking off regular upper body clothing?: None Help from another person to put on and taking off regular lower body clothing?: A Little 6 Click Score: 20   End of Session Equipment Utilized During Treatment: Gait belt;Rolling walker Nurse Communication: Mobility status  Activity Tolerance: Patient tolerated treatment well Patient left: in bed;with call bell/phone within reach;with nursing/sitter in room  OT Visit Diagnosis: Unsteadiness on feet (R26.81);Muscle weakness (generalized) (M62.81)                Time: 5366-4403 OT Time Calculation (min): 18 min Charges:  OT General Charges $OT Visit: 1 Visit OT Evaluation $OT Eval Moderate Complexity: [place], OTR/L [place]

## 2020-06-20 NOTE — Progress Notes (Signed)
PROGRESS NOTE    Heather Simmons  SFK:[id number] DOB: 02-23-1951 DOA: 06/18/2020 PCP: Dion Body, MD  Brief Narrative:  70 y.o.femalewith past medical history of hypertension, gout, and migraines who presents to the ED complaining of abnormal labs.  Patient was seen in the ED yesterday for 24 hours of generalized weakness, urinary frequency, and lower abdominal discomfort.  + diagnosed with a UTI at that time and started on cefdinir.  She states that she had a hard time sleeping last night and developed chills this morning felt like she was feverish but has not taken her temperature.  Pain has also extended into her right flank.  She describes this pain as sharp and constant, not exacerbated or alleviated by anything.   Found to have obstructing right ureteral stone.  Status post cystoscopy with right ureteral stent placement 06/18/2020.  Shock physiology improving.  Patient transferred out of ICU to [facility] service on 06/20/2020.  Blood pressures remained stable.  Seen by urology service in follow-up.    Assessment & Plan:   Principal Problem:   Acute pyelonephritis Active Problems:   Obstructive nephropathy   HTN (hypertension)   Gout   Migraine   Septic shock (HCC)  Septic shock secondary to gram-negative bacteremia in the setting of obstructive right ureteral stone Blood culture positive E. coli 06/17/2020 Urine culture demonstrate the same E. coli pansensitive Status post right ureteral stent placement 06/18/2020 Shock physiology improved Remains on midodrine and stress dose steroids Plan: Continue midodrine, dose decreased to 5 3 times daily Continue stress dose steroids, dose decreased to 50 every 8 hours Continue ceftriaxone Pain control as needed Antiemetics as needed Monitor vitals and fever curve Continue IV fluids If leukocytosis resolving plan on discharge on 06/21/2020 Appreciate urology follow-up  Essential hypertension Home Coreg Norvasc and Cozaar  on hold Monitor vitals and restart as appropriate  Gout Continue home allopurinol  History of migraines Continue on Topamax    DVT prophylaxis: SCD Code Status: Full Family Communication: Sister at bedside Disposition Plan: Status is: Inpatient  Remains inpatient appropriate because:Inpatient level of care appropriate due to severity of illness   Dispo: The patient is from: Home              Anticipated d/c is to: Home              Anticipated d/c date is: 1 day              Patient currently is not medically stable to d/c.   Difficult to place patient No  Possible discharge tomorrow if leukocytosis improves, patient remains fever free, pain improved.     Level of care: Med-Surg  Consultants:   Urology  Procedures:   Cystoscopy with right ureteral stent placement, 06/19/2020  Antimicrobials:   Rocephin   Subjective: Patient seen and examined.  Reports improvement in abdominal pain since admission.  Continues to endorse weakness.  Objective: Vitals:   06/19/20 2000 06/19/20 2058 06/19/20 2339 06/20/20 0505  BP: 116/69 123/66 (!) 103/57 123/73  Pulse: 68 67 64 70  Resp: (!) 23     Temp:  98 F (36.7 C) 98.2 F (36.8 C) 98.4 F (36.9 C)  TempSrc:  Oral Oral Oral  SpO2: 91% 95% 91% (!) 89%  Weight:        Intake/Output Summary (Last 24 hours) at 06/20/2020 0622 Last data filed at 06/20/2020 0611 Gross per 24 hour  Intake 2736.71 ml  Output 1400 ml  Net 1336.71 ml  Filed Weights   06/18/20 0847 06/18/20 1330  Weight: 125 kg 127.8 kg    Examination:  General exam: No acute distress Respiratory system: Lung sounds decreased at bases.  Normal work of breathing.  2 L Cardiovascular system: S1-S2, regular rate and rhythm, no murmurs  gastrointestinal system: Obese, nondistended, tender to palpation right flank, normal bowel sounds Central nervous system: Alert and oriented. No focal neurological deficits. Extremities: Symmetric 5 x 5 power. Skin:  No rashes, lesions or ulcers Psychiatry: Judgement and insight appear normal. Mood & affect appropriate.     Data Reviewed: I have personally reviewed following labs and imaging studies  CBC: Recent Labs  Lab 06/17/20 1338 06/18/20 0903 06/19/20 0606  WBC 5.1 31.2* 35.6*  NEUTROABS 4.6 26.4*  --   HGB 15.1* 14.1 13.1  HCT 46.3* 42.6 38.9  MCV 96.7 96.4 97.0  PLT 233 126* 638*   Basic Metabolic Panel: Recent Labs  Lab 06/17/20 1338 06/18/20 0903 06/19/20 0606 06/20/20 0526  NA 140 138 139 139  K 4.0 3.9 3.7 3.6  CL 106 107 106 109  CO2 23 16* 22 23  GLUCOSE 112* 96 78 98  BUN 22 34* 37* 40*  CREATININE 1.46* 2.64* 1.82* 1.58*  CALCIUM 9.2 8.2* 7.8* 8.2*  MG  --   --  1.8  --   PHOS  --   --  4.0 2.9   GFR: Estimated Creatinine Clearance: 44.6 mL/min (A) (by C-G formula based on SCr of 1.58 mg/dL (H)). Liver Function Tests: Recent Labs  Lab 06/17/20 1338 06/18/20 0903 06/20/20 0526  AST 23 40  --   ALT 18 20  --   ALKPHOS 129* 79  --   BILITOT 1.1 0.8  --   PROT 8.2* 6.5  --   ALBUMIN 4.0 2.9* 2.6*   Recent Labs  Lab 06/17/20 1338  LIPASE 25   No results for input(s): AMMONIA in the last 168 hours. Coagulation Profile: Recent Labs  Lab 06/17/20 1338  INR 1.0   Cardiac Enzymes: No results for input(s): CKTOTAL, CKMB, CKMBINDEX, TROPONINI in the last 168 hours. BNP (last 3 results) No results for input(s): PROBNP in the last 8760 hours. HbA1C: No results for input(s): HGBA1C in the last 72 hours. CBG: Recent Labs  Lab 06/18/20 1322  GLUCAP 97   Lipid Profile: No results for input(s): CHOL, HDL, LDLCALC, TRIG, CHOLHDL, LDLDIRECT in the last 72 hours. Thyroid Function Tests: No results for input(s): TSH, T4TOTAL, FREET4, T3FREE, THYROIDAB in the last 72 hours. Anemia Panel: No results for input(s): VITAMINB12, FOLATE, FERRITIN, TIBC, IRON, RETICCTPCT in the last 72 hours. Sepsis Labs: Recent Labs  Lab 06/18/20 1103 06/18/20 1124  06/18/20 1350 06/18/20 2130  PROCALCITON  --  >150.00  --   --   LATICACIDVEN 3.3* 2.5* 2.7* 1.7    Recent Results (from the past 240 hour(s))  Culture, blood (Routine x 2)     Status: Abnormal (Preliminary result)   Collection Time: 06/17/20  1:38 PM   Specimen: BLOOD  Result Value Ref Range Status   Specimen Description   Final    BLOOD RIGHT ANTECUBITAL Performed at [facility], [address] [place], [place] [address]    Special Requests   Final    BOTTLES DRAWN AEROBIC AND ANAEROBIC Blood Culture adequate volume Performed at [facility], [address], [place], [place] [address]    Culture  Setup Time   Final    GRAM NEGATIVE RODS IN  BOTH AEROBIC AND ANAEROBIC BOTTLES CRITICAL RESULT CALLED TO, READ BACK BY AND VERIFIED WITH: ANGELA ROBBINS AT 5621 06/18/20.PMF Performed at [facility], [address] [place], [place] [address]    Culture ESCHERICHIA COLI (A)  Final   Report Status PENDING  Incomplete  Urine Culture     Status: Abnormal (Preliminary result)   Collection Time: 06/17/20  1:38 PM   Specimen: Urine, Random  Result Value Ref Range Status   Specimen Description   Final    URINE, RANDOM Performed at [facility], [address], [place], [place] [address]    Special Requests   Final    NONE Performed at [facility], [address] [place], [place] [address]    Culture >=100,000 COLONIES/mL ESCHERICHIA COLI (A)  Final   Report Status PENDING  Incomplete  Culture, blood (Routine x 2)     Status: Abnormal (Preliminary result)   Collection Time: 06/17/20  1:42 PM   Specimen: BLOOD  Result Value Ref Range Status   Specimen Description   Final    BLOOD BLOOD LEFT WRIST Performed at [facility], [address] [place], [place] [address]    Special Requests   Final    BOTTLES DRAWN AEROBIC AND ANAEROBIC Blood Culture results may not be optimal due to an inadequate volume of blood received  in culture bottles Performed at [facility], [address] [place], [place] [address]    Culture  Setup Time   Final    GRAM NEGATIVE RODS IN BOTH AEROBIC AND ANAEROBIC BOTTLES CRITICAL RESULT CALLED TO, READ BACK BY AND VERIFIED WITH: ANGELA ROBBINS AT 0640 06/18/20.PMF    Culture (A)  Final    ESCHERICHIA COLI SUSCEPTIBILITIES TO FOLLOW Performed at [facility], [place] [address], [place], [place] [address]    Report Status PENDING  Incomplete  Blood Culture ID Panel (Reflexed)     Status: Abnormal   Collection Time: 06/17/20  1:42 PM  Result Value Ref Range Status   Enterococcus faecalis NOT DETECTED NOT DETECTED Final   Enterococcus Faecium NOT DETECTED NOT DETECTED Final   Listeria monocytogenes NOT DETECTED NOT DETECTED Final   Staphylococcus species NOT DETECTED NOT DETECTED Final   Staphylococcus aureus (BCID) NOT DETECTED NOT DETECTED Final   Staphylococcus epidermidis NOT DETECTED NOT DETECTED Final   Staphylococcus lugdunensis NOT DETECTED NOT DETECTED Final   Streptococcus species NOT DETECTED NOT DETECTED Final   Streptococcus agalactiae NOT DETECTED NOT DETECTED Final   Streptococcus pneumoniae NOT DETECTED NOT DETECTED Final   Streptococcus pyogenes NOT DETECTED NOT DETECTED Final   A.calcoaceticus-baumannii NOT DETECTED NOT DETECTED Final   Bacteroides fragilis NOT DETECTED NOT DETECTED Final   Enterobacterales DETECTED (A) NOT DETECTED Final    Comment: Enterobacterales represent a large order of gram negative bacteria, not a single organism. CRITICAL RESULT CALLED TO, READ BACK BY AND VERIFIED WITH: ANGELA ROBBINS AT 6644 06/18/20.PMF    Enterobacter cloacae complex NOT DETECTED NOT DETECTED Final   Escherichia coli DETECTED (A) NOT DETECTED Final    Comment: CRITICAL RESULT CALLED TO, READ BACK BY AND VERIFIED WITH: ANGELA ROBBINS AT 0347 06/18/20.PMF    Klebsiella aerogenes NOT DETECTED NOT DETECTED Final   Klebsiella oxytoca NOT DETECTED NOT  DETECTED Final   Klebsiella pneumoniae NOT DETECTED NOT DETECTED Final   Proteus species NOT DETECTED NOT DETECTED Final   Salmonella species NOT DETECTED NOT DETECTED Final   Serratia marcescens NOT DETECTED NOT DETECTED Final  Haemophilus influenzae NOT DETECTED NOT DETECTED Final   Neisseria meningitidis NOT DETECTED NOT DETECTED Final   Pseudomonas aeruginosa NOT DETECTED NOT DETECTED Final   Stenotrophomonas maltophilia NOT DETECTED NOT DETECTED Final   Candida albicans NOT DETECTED NOT DETECTED Final   Candida auris NOT DETECTED NOT DETECTED Final   Candida glabrata NOT DETECTED NOT DETECTED Final   Candida krusei NOT DETECTED NOT DETECTED Final   Candida parapsilosis NOT DETECTED NOT DETECTED Final   Candida tropicalis NOT DETECTED NOT DETECTED Final   Cryptococcus neoformans/gattii NOT DETECTED NOT DETECTED Final   CTX-M ESBL NOT DETECTED NOT DETECTED Final   Carbapenem resistance IMP NOT DETECTED NOT DETECTED Final   Carbapenem resistance KPC NOT DETECTED NOT DETECTED Final   Carbapenem resistance NDM NOT DETECTED NOT DETECTED Final   Carbapenem resist OXA 48 LIKE NOT DETECTED NOT DETECTED Final   Carbapenem resistance VIM NOT DETECTED NOT DETECTED Final    Comment: Performed at [facility], [address] [place], [place] [address]  Resp Panel by RT-PCR (Flu A&B, Covid) Nasopharyngeal Swab     Status: None   Collection Time: 06/17/20  2:54 PM   Specimen: Nasopharyngeal Swab; Nasopharyngeal(NP) swabs in vial transport medium  Result Value Ref Range Status   SARS Coronavirus 2 by RT PCR NEGATIVE NEGATIVE Final    Comment: (NOTE) SARS-CoV-2 target nucleic acids are NOT DETECTED.  The SARS-CoV-2 RNA is generally detectable in upper respiratory specimens during the acute phase of infection. The lowest concentration of SARS-CoV-2 viral copies this assay can detect is 138 copies/mL. A negative result does not preclude SARS-Cov-2 infection and should not be used as  the sole basis for treatment or other patient management decisions. A negative result may occur with  improper specimen collection/handling, submission of specimen other than nasopharyngeal swab, presence of viral mutation(s) within the areas targeted by this assay, and inadequate number of viral copies(<138 copies/mL). A negative result must be combined with clinical observations, patient history, and epidemiological information. The expected result is Negative.  Fact Sheet for Patients:  EntrepreneurPulse.com.au  Fact Sheet for Healthcare Providers:  IncredibleEmployment.be  This test is no t yet approved or cleared by the [place] FDA and  has been authorized for detection and/or diagnosis of SARS-CoV-2 by FDA under an Emergency Use Authorization (EUA). This EUA will remain  in effect (meaning this test can be used) for the duration of the COVID-19 declaration under Section 564(b)(1) of the Act, 21 U.S.C.section 360bbb-3(b)(1), unless the authorization is terminated  or revoked sooner.       Influenza A by PCR NEGATIVE NEGATIVE Final   Influenza B by PCR NEGATIVE NEGATIVE Final    Comment: (NOTE) The Xpert Xpress SARS-CoV-2/FLU/RSV plus assay is intended as an aid in the diagnosis of influenza from Nasopharyngeal swab specimens and should not be used as a sole basis for treatment. Nasal washings and aspirates are unacceptable for Xpert Xpress SARS-CoV-2/FLU/RSV testing.  Fact Sheet for Patients: EntrepreneurPulse.com.au  Fact Sheet for Healthcare Providers: IncredibleEmployment.be  This test is not yet approved or cleared by the [place] FDA and has been authorized for detection and/or diagnosis of SARS-CoV-2 by FDA under an Emergency Use Authorization (EUA). This EUA will remain in effect (meaning this test can be used) for the duration of the COVID-19 declaration under Section 564(b)(1) of  the Act, 21 U.S.C. section 360bbb-3(b)(1), unless the authorization is terminated or revoked.  Performed at [facility], [address], [place], [place] [address]  Blood culture (single)     Status: None (Preliminary result)   Collection Time: 06/18/20  9:03 AM   Specimen: BLOOD LEFT WRIST  Result Value Ref Range Status   Specimen Description BLOOD LEFT WRIST  Final   Special Requests   Final    BOTTLES DRAWN AEROBIC AND ANAEROBIC Blood Culture adequate volume   Culture   Final    NO GROWTH < 24 HOURS Performed at [facility], [address], [place], [place] [address]    Report Status PENDING  Incomplete  Resp Panel by RT-PCR (Flu A&B, Covid) Nasopharyngeal Swab     Status: None   Collection Time: 06/18/20  9:55 AM   Specimen: Nasopharyngeal Swab; Nasopharyngeal(NP) swabs in vial transport medium  Result Value Ref Range Status   SARS Coronavirus 2 by RT PCR NEGATIVE NEGATIVE Final    Comment: (NOTE) SARS-CoV-2 target nucleic acids are NOT DETECTED.  The SARS-CoV-2 RNA is generally detectable in upper respiratory specimens during the acute phase of infection. The lowest concentration of SARS-CoV-2 viral copies this assay can detect is 138 copies/mL. A negative result does not preclude SARS-Cov-2 infection and should not be used as the sole basis for treatment or other patient management decisions. A negative result may occur with  improper specimen collection/handling, submission of specimen other than nasopharyngeal swab, presence of viral mutation(s) within the areas targeted by this assay, and inadequate number of viral copies(<138 copies/mL). A negative result must be combined with clinical observations, patient history, and epidemiological information. The expected result is Negative.  Fact Sheet for Patients:  EntrepreneurPulse.com.au  Fact Sheet for Healthcare Providers:  IncredibleEmployment.be  This test  is no t yet approved or cleared by the [place] FDA and  has been authorized for detection and/or diagnosis of SARS-CoV-2 by FDA under an Emergency Use Authorization (EUA). This EUA will remain  in effect (meaning this test can be used) for the duration of the COVID-19 declaration under Section 564(b)(1) of the Act, 21 U.S.C.section 360bbb-3(b)(1), unless the authorization is terminated  or revoked sooner.       Influenza A by PCR NEGATIVE NEGATIVE Final   Influenza B by PCR NEGATIVE NEGATIVE Final    Comment: (NOTE) The Xpert Xpress SARS-CoV-2/FLU/RSV plus assay is intended as an aid in the diagnosis of influenza from Nasopharyngeal swab specimens and should not be used as a sole basis for treatment. Nasal washings and aspirates are unacceptable for Xpert Xpress SARS-CoV-2/FLU/RSV testing.  Fact Sheet for Patients: EntrepreneurPulse.com.au  Fact Sheet for Healthcare Providers: IncredibleEmployment.be  This test is not yet approved or cleared by the [place] FDA and has been authorized for detection and/or diagnosis of SARS-CoV-2 by FDA under an Emergency Use Authorization (EUA). This EUA will remain in effect (meaning this test can be used) for the duration of the COVID-19 declaration under Section 564(b)(1) of the Act, 21 U.S.C. section 360bbb-3(b)(1), unless the authorization is terminated or revoked.  Performed at [facility], [address], [place], [place] [address]          Radiology Studies: [facility] Chest [place] 1 View  Result Date: 06/19/2020 CLINICAL DATA:  Assess for interval change. Admitted patient with acute pyelonephritis. EXAM: PORTABLE CHEST 1 VIEW COMPARISON:  Radiograph 06/17/2020 FINDINGS: Stable lung volumes. Stable cardiomegaly. Unchanged mediastinal contours. No focal airspace disease. No pulmonary edema. No pleural fluid or pneumothorax. No acute osseous abnormalities are seen. IMPRESSION: No  significant interval change.  Stable cardiomegaly. Electronically Signed   By: Threasa Beards  Sanford M.D.   On: 06/19/2020 03:47   DG OR UROLOGY CYSTO IMAGE ([place])  Result Date: 06/18/2020 There is no interpretation for this exam.  This order is for images obtained during a surgical procedure.  Please See "Surgeries" Tab for more information regarding the procedure.   CT Renal Stone Study  Result Date: 06/18/2020 CLINICAL DATA:  Abdominal pain with nausea and vomiting EXAM: CT ABDOMEN AND PELVIS WITHOUT CONTRAST TECHNIQUE: Multidetector CT imaging of the abdomen and pelvis was performed following the standard protocol without oral or IV contrast. COMPARISON:  None. FINDINGS: Lower chest: Lung bases are clear. Hepatobiliary: There is hepatic steatosis. No focal liver lesions are evident on this noncontrast enhanced study. There are laminated gallstones within the gallbladder. The gallbladder wall does not appear thickened. There is no appreciable biliary duct dilatation. Pancreas: There is no appreciable pancreatic mass or inflammatory focus. Fatty infiltration is noted in portions of the head of the pancreas. Spleen: No splenic lesions are evident. Adrenals/Urinary Tract: Adrenals bilaterally appear normal. The right kidney is edematous. There is a suspected parapelvic cyst on the right measuring 3.5 x 2.7 cm. There is also mild hydronephrosis on the right. There is a calculus in the lower pole of the right kidney in the lateral pelvic region measuring 1.1 x 0.9 cm. No other renal calculi are evident. There is subtle edema around the proximal right ureter. No ureteral calculus is evident. There is perinephric soft tissue thickening on the right with thickening of the mesentery in the right mid abdomen extending inferiorly as well as medial and lateral to the right kidney. No well-defined abscess seen in this area. There is a calculus at the right ureterovesical junction measuring 4 x 3 mm. There is  thickening of the wall of the urinary bladder. Stomach/Bowel: There is no appreciable bowel wall or mesenteric thickening. Loops of small bowel extend into a pelvic midline ventral hernia without demonstrable bowel compromise. There is no appreciable bowel obstruction. Terminal ileum appears normal. There is no evident free air or portal venous air. Vascular/Lymphatic: There are foci of aortic atherosclerosis. No adenopathy is evident in the abdomen or pelvis. Reproductive: Uterus is absent.  No adnexal mass lesions evident. Other: No periappendiceal region inflammation. No ascites or abscess in the abdomen pelvis. As stated above, there is a midline pelvic ventral hernia containing loops of small bowel. This hernia at its neck measures 3.8 cm from right to left dimension and 2.7 cm from superior to inferior dimension. There is a hernia along the left lower quadrant abdominal wall which contains fat but no bowel. This hernia at its neck measures 5.1 cm from right to left dimension and 3.1 cm from superior to inferior dimension. There is a smaller midline ventral hernia near the umbilicus which contains fat but no bowel. This hernia measures 1.5 cm from right to left dimension at its neck and 2.6 cm from superior to inferior dimension at its neck. There are areas of scarring in the anterior lower abdominal and pelvic wall regions. Musculoskeletal: There is degenerative change in the lower thoracic and lumbar regions. 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 There is spinal stenosis at L2-3, L3-4, and L4-5 due to disc protrusion and bony hypertrophy. No blastic or lytic bone lesions. No intramuscular lesions are evident. IMPRESSION: 1. Mild hydronephrosis on the right. There is a 4 x 3 mm calculus at the right ureterovesical junction. The right kidney is edematous with soft tissue stranding and thickening throughout perinephric fascia on the  right. Focal right renal abscess. The changes in the mesentery on the right may be due to  the ureteral calculus but also could be indicative of a degree of pyelonephritis on the right. There is also a calculus in the lower pole right kidney measuring 1.1 x 0.9 cm. 2. Thickening of the urinary bladder wall, a finding likely indicative of a degree of cystitis. 3.  Cholelithiasis.  No gallbladder wall thickening. 4. Ventral hernias as noted. There are loops of small bowel in a midline ventral pelvic hernia without bowel compromise. Other hernias containing fat but no bowel. 5. Multilevel spinal stenosis due to disc protrusion and bony hypertrophy. 6.  Aortic Atherosclerosis (ICD10-I70.0). 7.  Hepatic steatosis. 8.  Uterus absent. Electronically Signed   By: Lowella Grip III M.D.   On: 06/18/2020 09:47        Scheduled Meds: . Chlorhexidine Gluconate Cloth  6 each Topical Daily  . hydrocortisone sod succinate (SOLU-CORTEF) inj  50 mg Intravenous Q6H  . midodrine  10 mg Oral TID WC  . sodium chloride flush  3 mL Intravenous Q12H   Continuous Infusions: . sodium chloride    . sodium chloride    . cefTRIAXone (ROCEPHIN)  IV Stopped (06/19/20 1615)  . famotidine (PEPCID) IV Stopped (06/19/20 2305)  . lactated ringers 125 mL/hr at 06/20/20 0544     LOS: 2 days    Time spent: 25 minutes    Sidney Ace, MD [facility] Pager [phone]  If 7PM-7AM, please contact night-coverage 06/20/2020, 6:22 AM

## 2020-06-20 NOTE — Evaluation (Signed)
Physical Therapy Evaluation Patient Details Name: Heather Simmons MRN: [id number] DOB: 1951/03/27 Today's Date: 06/20/2020   History of Present Illness  presented to ER secondary to abnormal lab values, R flank pain; admitted for management of septic shock related to infected kidney stone, s/p ureteral stent (06/18/20)  Clinical Impression  Patient resting in bed, sister at bedside upon arrival to room.  Alert and oriented; follows commands and agreeable to session. Mildly hyperverbal at times, requiring redirection to task at hand. Mild STM deficits noted, as patient often repeats self without awareness during session.  Bilat UE/LE strength and ROM grossly symmetrical and WFL; no focal weakness appreciated.  Able to complete bed mobility with min assist; sit/stand, basic transfers and gait (200') with RW, cga/close sup.  Demonstrates forward flexed posture, RW slightly anterior to BOS; reciprocal stepping pattern with fair step height/length; fair cadence. Mild SOB with exertion; desat to 87-88% with gait, quickly recovering to >90% within 20-30 seconds of seated rest Would benefit from skilled PT to address above deficits and promote optimal return to PLOF.; Recommend transition to HHPT upon discharge from acute hospitalization.     Follow Up Recommendations Home health PT    Equipment Recommendations  Rolling walker with 5" wheels    Recommendations for Other Services       Precautions / Restrictions Precautions Precautions: Fall Restrictions Weight Bearing Restrictions: No      Mobility  Bed Mobility Overal bed mobility: Needs Assistance Bed Mobility: Supine to Sit     Supine to sit: Min assist          Transfers Overall transfer level: Needs assistance Equipment used: Rolling walker (2 wheeled) Transfers: Sit to/from Stand Sit to Stand: Min guard;Min assist         General transfer comment: tends to pull on RW, increased use of momentum for anterior weight  translation and lift off from seating surface  Ambulation/Gait Ambulation/Gait assistance: Min assist Gait Distance (Feet): 200 Feet Assistive device: Rolling walker (2 wheeled)       General Gait Details: forward flexed posture, RW slightly anterior to BOS; reciprocal stepping pattern with fair step height/length; fair cadence. Mild SOB with exertion; desat to 87-88% with gait, quickly recovering to >90% within 20-30 seconds of seated rest  Stairs            Wheelchair Mobility    Modified Rankin (Stroke Patients Only)       Balance Overall balance assessment: Needs assistance Sitting-balance support: No upper extremity supported;Feet supported Sitting balance-Leahy Scale: Good     Standing balance support: Bilateral upper extremity supported Standing balance-Leahy Scale: Fair                               Pertinent Vitals/Pain Pain Assessment: No/denies pain    Home Living Family/patient expects to be discharged to:: Private residence Living Arrangements: Alone Available Help at Discharge: Family;Available PRN/intermittently Type of Home: House Home Access: Stairs to enter Entrance Stairs-Rails: Right;Left;Can reach both Entrance Stairs-Number of Steps: 3+1 Home Layout: One level        Prior Function Level of Independence: Independent         Comments: Indep with ADLs, household and community mobilization without assist device; + driving, short-distances during the day; sisters/family assist with iADLs, community needs as needed     Hand Dominance        Extremity/Trunk Assessment   Upper Extremity Assessment Upper Extremity Assessment:  Overall [facility] for tasks assessed    Lower Extremity Assessment Lower Extremity Assessment: Overall WFL for tasks assessed (grossly 4/5 throughout; no focal weakness)       Communication   Communication: No difficulties  Cognition Arousal/Alertness: Awake/alert Behavior During Therapy: WFL for  tasks assessed/performed Overall Cognitive Status: Within Functional Limits for tasks assessed                                 General Comments: mild STM deficits noted (repeating same story/information multiple times during session)      General Comments      Exercises Other Exercises Other Exercises: Toilet transfer, ambulatory with RW, cga/close sup; able to negotiate surface changes, crowded environment without buckling, LOB. Does require intermittent cuing for safety awareness; quick to release grasp and step away from RW at times.  Sit/stand from standard height toilet with grab bar, RW, close sup.  Standing balance at sink for hand hygiene, close sup; functional reach approx 4" from immediate BOS, self-initiating use of contralateral UE for stabilization as needed.   Assessment/Plan    PT Assessment Patient needs continued PT services  PT Problem List Decreased activity tolerance;Decreased balance;Decreased mobility;Decreased coordination;Decreased cognition;Decreased knowledge of use of DME;Cardiopulmonary status limiting activity;Decreased knowledge of precautions;Obesity;Decreased safety awareness       PT Treatment Interventions DME instruction;Gait training;Stair training;Functional mobility training;Therapeutic exercise;Therapeutic activities;Patient/family education;Balance training    PT Goals (Current goals can be found in the Care Plan section)  Acute Rehab PT Goals Patient Stated Goal: to return home PT Goal Formulation: With patient/family Time For Goal Achievement: 07/04/20 Potential to Achieve Goals: Good    Frequency Min 2X/week   Barriers to discharge        Co-evaluation               AM-PAC PT "6 Clicks" Mobility  Outcome Measure Help needed turning from your back to your side while in a flat bed without using bedrails?: None Help needed moving from lying on your back to sitting on the side of a flat bed without using bedrails?: A  Little Help needed moving to and from a bed to a chair (including a wheelchair)?: A Little Help needed standing up from a chair using your arms (e.g., wheelchair or bedside chair)?: A Little Help needed to walk in hospital room?: A Little Help needed climbing 3-5 steps with a railing? : A Little 6 Click Score: 19    End of Session Equipment Utilized During Treatment: Gait belt Activity Tolerance: Patient tolerated treatment well Patient left: in chair;with call bell/phone within reach;with chair alarm set;with family/visitor present Nurse Communication: Mobility status PT Visit Diagnosis: Muscle weakness (generalized) (M62.81);Difficulty in walking, not elsewhere classified (R26.2)    Time: 1601-0932 PT Time Calculation (min) (ACUTE ONLY): 22 min   Charges:   PT Evaluation $PT Eval Moderate Complexity: 1 Mod PT Treatments $Therapeutic Activity: 8-22 mins        Shakesha Soltau H. Owens Shark, PT, DPT, NCS 06/20/20, 2:43 PM [phone]

## 2020-06-20 NOTE — Progress Notes (Addendum)
Urology Inpatient Progress Note  Subjective: No acute events overnight. Patient is afebrile, VSS. WBC count up today, 37.7. Creatinine down, 1.58. Urine culture positive for pansensitive E coli. Initial blood cultures with the same. Repeat blood cultures with no growth at <24 hours. On antibiotics as below. Foley catheter in place draining clear, yellow urine. Patient reports occasional sensations of urgency to void but otherwise denies flank pain.  Anti-infectives: Anti-infectives (From admission, onward)   Start     Dose/Rate Route Frequency Ordered Stop   06/18/20 1600  cefTRIAXone (ROCEPHIN) 2 g in sodium chloride 0.9 % 100 mL IVPB        2 g 200 mL/hr over 30 Minutes Intravenous Every 24 hours 06/18/20 1420     06/18/20 1500  meropenem (MERREM) 1 g in sodium chloride 0.9 % 100 mL IVPB  Status:  Discontinued        1 g 200 mL/hr over 30 Minutes Intravenous Every 12 hours 06/18/20 1412 06/18/20 1419   06/18/20 1000  piperacillin-tazobactam (ZOSYN) IVPB 4.5 g        4.5 g 200 mL/hr over 30 Minutes Intravenous  Once 06/18/20 0903 06/18/20 1037      Current Facility-Administered Medications  Medication Dose Route Frequency Provider Last Rate Last Admin  . 0.9 %  sodium chloride infusion  250 mL Intravenous Continuous Blake Divine, MD      . 0.9 %  sodium chloride infusion  250 mL Intravenous PRN Flora Lipps, MD      . acetaminophen (TYLENOL) tablet 650 mg  650 mg Oral Q6H PRN Ivor Costa, MD   650 mg at 06/19/20 1316  . acetaminophen (TYLENOL) tablet 650 mg  650 mg Oral Q4H PRN Flora Lipps, MD   650 mg at 06/20/20 0756  . cefTRIAXone (ROCEPHIN) 2 g in sodium chloride 0.9 % 100 mL IVPB  2 g Intravenous Q24H Flora Lipps, MD   Stopped at 06/19/20 1615  . Chlorhexidine Gluconate Cloth 2 % PADS 6 each  6 each Topical Daily Flora Lipps, MD   6 each at 06/19/20 0818  . docusate sodium (COLACE) capsule 100 mg  100 mg Oral BID PRN Flora Lipps, MD      . famotidine (PEPCID) IVPB 20 mg  premix  20 mg Intravenous Q12H Flora Lipps, MD 100 mL/hr at 06/20/20 0750 20 mg at 06/20/20 0750  . hydrocortisone sodium succinate (SOLU-CORTEF) 100 MG injection 50 mg  50 mg Intravenous Q6H Flora Lipps, MD   50 mg at 06/20/20 0751  . lactated ringers infusion   Intravenous Continuous Ivor Costa, MD 125 mL/hr at 06/20/20 0544 Infusion Verify at 06/20/20 0544  . midodrine (PROAMATINE) tablet 10 mg  10 mg Oral TID WC Flora Lipps, MD   10 mg at 06/20/20 0750  . ondansetron (ZOFRAN) injection 4 mg  4 mg Intravenous Q8H PRN Ivor Costa, MD      . ondansetron [facility]) injection 4 mg  4 mg Intravenous Q6H PRN Flora Lipps, MD   4 mg at 06/20/20 0756  . polyethylene glycol (MIRALAX / GLYCOLAX) packet 17 g  17 g Oral Daily PRN Flora Lipps, MD      . sodium chloride flush (NS) 0.9 % injection 3 mL  3 mL Intravenous Q12H Flora Lipps, MD   3 mL at 06/19/20 2236  . sodium chloride flush (NS) 0.9 % injection 3 mL  3 mL Intravenous PRN Flora Lipps, MD       Objective: Vital signs in last 24 hours:  Temp:  [97.9 F (36.6 C)-98.4 F (36.9 C)] 97.9 F (36.6 C) (02/14 0747) Pulse Rate:  [64-78] 70 (02/14 0747) Resp:  [20-28] 20 (02/14 0747) BP: (103-124)/(52-88) 124/81 (02/14 0747) SpO2:  [89 %-95 %] 95 % (02/14 0747)  Intake/Output from previous day: 02/13 0701 - 02/14 0700 In: 2736.7 [I.V.:2536.7; IV Piggyback:200] Out: 1400 [Urine:1400] Intake/Output this shift: No intake/output data recorded.  Physical Exam Vitals and nursing note reviewed.  Constitutional:      General: She is not in acute distress.    Appearance: She is not ill-appearing, toxic-appearing or diaphoretic.  HENT:     Head: Normocephalic and atraumatic.  Pulmonary:     Effort: Pulmonary effort is normal. No respiratory distress.  Skin:    General: Skin is warm and dry.  Neurological:     Mental Status: She is alert and oriented to person, place, and time.  Psychiatric:        Mood and Affect: Mood normal.         Behavior: Behavior normal.    Lab Results:  Recent Labs    06/19/20 0606 06/20/20 0526  WBC 35.6* 37.7*  HGB 13.1 12.7  HCT 38.9 37.7  PLT 100* 105*   BMET Recent Labs    06/19/20 0606 06/20/20 0526  NA 139 139  K 3.7 3.6  CL 106 109  CO2 22 23  GLUCOSE 78 98  BUN 37* 40*  CREATININE 1.82* 1.58*  CALCIUM 7.8* 8.2*   PT/INR Recent Labs    06/17/20 1338  LABPROT 12.8  INR 1.0   ABG Recent Labs    06/19/20 0500  PHART 7.35  HCO3 22.1    Assessment & Plan: 70 year old female POD 2 from right ureteral stent placement with Dr. Jeffie Pollock for management of a obstructing 4 mm right UVJ stone with sepsis. Patient clinically improving on culture appropriate antibiotics with mild stent symptoms.  Recommendations: -Discontinue Foley catheter today -Consider oxybutynin 5 mg every 8 hours as needed for management of mild bladder spasms -Continue antibiotics for total of 14 days of culture appropriate therapy -Outpatient follow-up with Dr. Diamantina Providence in 2 weeks to discuss definitive stone management  Debroah Loop, PA-C 06/20/2020

## 2020-06-21 DIAGNOSIS — N10 Acute tubulo-interstitial nephritis: Secondary | ICD-10-CM | POA: Diagnosis not present

## 2020-06-21 LAB — CBC WITH DIFFERENTIAL/PLATELET
Abs Immature Granulocytes: 1.27 10*3/uL — ABNORMAL HIGH (ref 0.00–0.07)
Basophils Absolute: 0 10*3/uL (ref 0.0–0.1)
Basophils Relative: 0 %
Eosinophils Absolute: 0.1 10*3/uL (ref 0.0–0.5)
Eosinophils Relative: 0 %
HCT: 38.9 % (ref 36.0–46.0)
Hemoglobin: 12.9 g/dL (ref 12.0–15.0)
Immature Granulocytes: 5 %
Lymphocytes Relative: 11 %
Lymphs Abs: 2.5 10*3/uL (ref 0.7–4.0)
MCH: 31.8 pg (ref 26.0–34.0)
MCHC: 33.2 g/dL (ref 30.0–36.0)
MCV: 95.8 fL (ref 80.0–100.0)
Monocytes Absolute: 1 10*3/uL (ref 0.1–1.0)
Monocytes Relative: 4 %
Neutro Abs: 18.7 10*3/uL — ABNORMAL HIGH (ref 1.7–7.7)
Neutrophils Relative %: 80 %
Platelets: 118 10*3/uL — ABNORMAL LOW (ref 150–400)
RBC: 4.06 MIL/uL (ref 3.87–5.11)
RDW: 13.2 % (ref 11.5–15.5)
Smear Review: NORMAL
WBC: 23.5 10*3/uL — ABNORMAL HIGH (ref 4.0–10.5)
nRBC: 0 % (ref 0.0–0.2)

## 2020-06-21 LAB — BASIC METABOLIC PANEL
Anion gap: 10 (ref 5–15)
BUN: 34 mg/dL — ABNORMAL HIGH (ref 8–23)
CO2: 24 mmol/L (ref 22–32)
Calcium: 8.5 mg/dL — ABNORMAL LOW (ref 8.9–10.3)
Chloride: 108 mmol/L (ref 98–111)
Creatinine, Ser: 1.4 mg/dL — ABNORMAL HIGH (ref 0.44–1.00)
GFR, Estimated: 40 mL/min — ABNORMAL LOW (ref >60)
Glucose, Bld: 121 mg/dL — ABNORMAL HIGH (ref 70–99)
Potassium: 3.6 mmol/L (ref 3.5–5.1)
Sodium: 142 mmol/L (ref 135–145)

## 2020-06-21 MED ORDER — CEFDINIR 300 MG PO CAPS
300.0000 mg | ORAL_CAPSULE | Freq: Two times a day (BID) | ORAL | Status: DC
  Administered 2020-06-21: 300 mg via ORAL
  Filled 2020-06-21 (×2): qty 1

## 2020-06-21 MED ORDER — CIPROFLOXACIN HCL 500 MG PO TABS
500.0000 mg | ORAL_TABLET | Freq: Two times a day (BID) | ORAL | Status: DC
  Administered 2020-06-21 – 2020-06-22 (×2): 500 mg via ORAL
  Filled 2020-06-21 (×2): qty 1

## 2020-06-21 NOTE — TOC Initial Note (Signed)
Transition of Care [facility]) - Initial/Assessment Note    Patient Details  Name: Heather Simmons MRN: [id number] Date of Birth: 08-30-1950  [facility]) CM/SW Contact:    Beverly Sessions, RN Phone Number: 06/21/2020, 3:27 PM  Clinical Narrative:                 Patient admitted from home  With pyelonephritis  Patient lives at home alone PCP [place]  Denies issues obtaining medications or with transportation  PT has assessed patient and recommends home health PT.  MD has ordered home health PT and OT.  Patient agreeable states she does not have a preference of agency.  Referral made to [facility].   RW delivered to room by Mardene Celeste with [employer]   Expected Discharge Plan: [place] Barriers to Discharge: Continued Medical Work up   Patient Goals and CMS Choice        Expected Discharge Plan and Services Expected Discharge Plan: [place]   Discharge Planning Services: CM Consult   Living arrangements for the past 2 months: Single Family Home                 DME Arranged: Walker rolling DME Agency: [employer] Date DME Agency Contacted: 06/21/20   Representative spoke with at DME Agency: [place]: PT,OT [place] Agency: [facility] [place] Date [place]: 06/21/20   Representative spoke with at [place]: Joelene Millin  Prior Living Arrangements/Services Living arrangements for the past 2 months: [place] with:: Self Patient language and need for interpreter reviewed:: Yes Do you feel safe going back to the place where you live?: Yes            Criminal Activity/Legal Involvement Pertinent to Current Situation/Hospitalization: No - Comment as needed  Activities of Daily Living   ADL Screening (condition at time of admission) Patient's cognitive ability adequate to safely complete daily activities?: Yes Is the patient deaf or have difficulty hearing?:  No Does the patient have difficulty seeing, even when wearing glasses/contacts?: No Does the patient have difficulty concentrating, remembering, or making decisions?: No Patient able to express need for assistance with ADLs?: Yes Does the patient have difficulty dressing or bathing?: No Independently performs ADLs?: Yes (appropriate for developmental age) Weakness of Arms/Hands: None  Permission Sought/Granted                  Emotional Assessment Appearance:: Appears stated age     Orientation: : Oriented to Self,Oriented to Place,Oriented to  Time,Oriented to Situation Alcohol / Substance Use: Not Applicable Psych Involvement: No (comment)  Admission diagnosis:  Kidney stone [N20.0] Pyelonephritis [N12] Acute pyelonephritis [N10] Septic shock ([place]) [A41.9, R65.21] Sepsis with acute renal failure and septic shock, due to unspecified organism, unspecified acute renal failure type ([place]) [A41.9, R65.21, N17.9] Patient Active Problem List   Diagnosis Date Noted  . Acute pyelonephritis 06/18/2020  . Obstructive nephropathy 06/18/2020  . HTN (hypertension) 06/18/2020  . Gout 06/18/2020  . Migraine 06/18/2020  . Septic shock ([place]) 06/18/2020   PCP:  Dion Body, MD Pharmacy:   [facility], [place] [address] Phone: [phone] Fax: [phone]     Social Determinants of Health (SDOH) Interventions    Readmission Risk Interventions No flowsheet data found.

## 2020-06-21 NOTE — Progress Notes (Signed)
PROGRESS NOTE    Heather Simmons  GQQ:[id number] DOB: Feb 25, 1951 DOA: 06/18/2020 PCP: Dion Body, MD  Brief Narrative:  70 y.o.femalewith past medical history of hypertension, gout, and migraines who presents to the ED complaining of abnormal labs.  Patient was seen in the ED yesterday for 24 hours of generalized weakness, urinary frequency, and lower abdominal discomfort.  + diagnosed with a UTI at that time and started on cefdinir.  She states that she had a hard time sleeping last night and developed chills this morning felt like she was feverish but has not taken her temperature.  Pain has also extended into her right flank.  She describes this pain as sharp and constant, not exacerbated or alleviated by anything.   Found to have obstructing right ureteral stone.  Status post cystoscopy with right ureteral stent placement 06/18/2020.  Shock physiology improving.  Patient transferred out of ICU to [facility] service on 06/20/2020.  Blood pressures remained stable.  Seen by urology service in follow-up.  Remains hemodynamically stable but weak and deconditioned.  White count trending down.  Kidney function improving but not yet at baseline.    Assessment & Plan:   Principal Problem:   Acute pyelonephritis Active Problems:   Obstructive nephropathy   HTN (hypertension)   Gout   Migraine   Septic shock (HCC)  Septic shock secondary to gram-negative bacteremia in the setting of obstructive right ureteral stone Blood culture positive E. coli 06/17/2020 Urine culture demonstrate the same E. coli pansensitive Status post right ureteral stent placement 06/18/2020 Shock physiology improved Remains on midodrine and stress dose steroids Plan: Continue midodrine, dose 5 3 times daily, can likely discontinue tomorrow DC stress dose steroids DC IV fluids DC Rocephin Start Omnicef 300 twice daily Pain control as needed Antiemetics as needed Monitor vitals and fever curve If  leukocytosis resolving plan on discharge on 06/22/2020 Appreciate urology follow-up Recommend 7 to 10-day course of oral antibiotics at time of discharge  Essential hypertension Home Coreg Norvasc and Cozaar on hold Monitor vitals and restart as appropriate  Gout Continue home allopurinol  History of migraines Continue on Topamax    DVT prophylaxis: SCD Code Status: Full Family Communication: Sister at bedside, 06/20/2020 Disposition Plan: Status is: Inpatient  Remains inpatient appropriate because:Inpatient level of care appropriate due to severity of illness   Dispo: The patient is from: Home              Anticipated d/c is to: Home              Anticipated d/c date is: 1 day              Patient currently is not medically stable to d/c.   Difficult to place patient No  Anticipate discharge tomorrow if kidney function back at baseline leukocytosis continues to improve     Level of care: Med-Surg  Consultants:   Urology  Procedures:   Cystoscopy with right ureteral stent placement, 06/19/2020  Antimicrobials:   Rocephin   Subjective: Patient seen and examined.  Abdominal pain improved.  Continues to endorse fatigue and weakness  Objective: Vitals:   06/20/20 1936 06/21/20 0427 06/21/20 0745 06/21/20 1128  BP: (!) 147/86 119/65 135/74 (!) 159/78  Pulse: 63 62 66 65  Resp: 16 16 17 17   Temp: 98.9 F (37.2 C) 98.1 F (36.7 C) 98.7 F (37.1 C) 98.4 F (36.9 C)  TempSrc: Oral Oral Oral Oral  SpO2: 94% 93% 95% 98%  Weight:  Intake/Output Summary (Last 24 hours) at 06/21/2020 1302 Last data filed at 06/20/2020 1352 Gross per 24 hour  Intake 1242.33 ml  Output -  Net 1242.33 ml   Filed Weights   06/18/20 0847 06/18/20 1330  Weight: 125 kg 127.8 kg    Examination:  General exam: No acute distress Respiratory system: Lung sounds decreased at bases.  Normal work of breathing.  2 L Cardiovascular system: S1-S2, regular rate and rhythm, no  murmurs  gastrointestinal system: Obese, nondistended, tender to palpation right flank, normal bowel sounds Central nervous system: Alert and oriented. No focal neurological deficits. Extremities: Symmetric 5 x 5 power. Skin: No rashes, lesions or ulcers Psychiatry: Judgement and insight appear normal. Mood & affect appropriate.     Data Reviewed: I have personally reviewed following labs and imaging studies  CBC: Recent Labs  Lab 06/17/20 1338 06/18/20 0903 06/19/20 0606 06/20/20 0526 06/21/20 0822  WBC 5.1 31.2* 35.6* 37.7* 23.5*  NEUTROABS 4.6 26.4*  --  25.4* 18.7*  HGB 15.1* 14.1 13.1 12.7 12.9  HCT 46.3* 42.6 38.9 37.7 38.9  MCV 96.7 96.4 97.0 95.0 95.8  PLT 233 126* 100* 105* 734*   Basic Metabolic Panel: Recent Labs  Lab 06/17/20 1338 06/18/20 0903 06/19/20 0606 06/20/20 0526 06/21/20 0822  NA 140 138 139 139 142  K 4.0 3.9 3.7 3.6 3.6  CL 106 107 106 109 108  CO2 23 16* 22 23 24   GLUCOSE 112* 96 78 98 121*  BUN 22 34* 37* 40* 34*  CREATININE 1.46* 2.64* 1.82* 1.58* 1.40*  CALCIUM 9.2 8.2* 7.8* 8.2* 8.5*  MG  --   --  1.8 2.4  --   PHOS  --   --  4.0 2.9  --    GFR: Estimated Creatinine Clearance: 50.4 mL/min (A) (by C-G formula based on SCr of 1.4 mg/dL (H)). Liver Function Tests: Recent Labs  Lab 06/17/20 1338 06/18/20 0903 06/20/20 0526  AST 23 40  --   ALT 18 20  --   ALKPHOS 129* 79  --   BILITOT 1.1 0.8  --   PROT 8.2* 6.5  --   ALBUMIN 4.0 2.9* 2.6*   Recent Labs  Lab 06/17/20 1338  LIPASE 25   No results for input(s): AMMONIA in the last 168 hours. Coagulation Profile: Recent Labs  Lab 06/17/20 1338  INR 1.0   Cardiac Enzymes: No results for input(s): CKTOTAL, CKMB, CKMBINDEX, TROPONINI in the last 168 hours. BNP (last 3 results) No results for input(s): PROBNP in the last 8760 hours. HbA1C: No results for input(s): HGBA1C in the last 72 hours. CBG: Recent Labs  Lab 06/18/20 1322  GLUCAP 97   Lipid Profile: No  results for input(s): CHOL, HDL, LDLCALC, TRIG, CHOLHDL, LDLDIRECT in the last 72 hours. Thyroid Function Tests: No results for input(s): TSH, T4TOTAL, FREET4, T3FREE, THYROIDAB in the last 72 hours. Anemia Panel: No results for input(s): VITAMINB12, FOLATE, FERRITIN, TIBC, IRON, RETICCTPCT in the last 72 hours. Sepsis Labs: Recent Labs  Lab 06/18/20 1103 06/18/20 1124 06/18/20 1350 06/18/20 2130  PROCALCITON  --  >150.00  --   --   LATICACIDVEN 3.3* 2.5* 2.7* 1.7    Recent Results (from the past 240 hour(s))  Culture, blood (Routine x 2)     Status: Abnormal   Collection Time: 06/17/20  1:38 PM   Specimen: BLOOD  Result Value Ref Range Status   Specimen Description   Final    BLOOD RIGHT ANTECUBITAL Performed at  [facility], [address], [place], [place] [address]    Special Requests   Final    BOTTLES DRAWN AEROBIC AND ANAEROBIC Blood Culture adequate volume Performed at [facility], [address] [place], [place] [address]    Culture  Setup Time   Final    GRAM NEGATIVE RODS IN BOTH AEROBIC AND ANAEROBIC BOTTLES CRITICAL RESULT CALLED TO, READ BACK BY AND VERIFIED WITH: ANGELA ROBBINS AT 3474 06/18/20.PMF Performed at [facility], [address] [place], [place] [address]    Culture (A)  Final    ESCHERICHIA COLI SUSCEPTIBILITIES PERFORMED ON PREVIOUS CULTURE WITHIN THE LAST 5 DAYS. Performed at [facility], [place] [address] [place], [place] [address]    Report Status 06/20/2020 FINAL  Final  Urine Culture     Status: Abnormal   Collection Time: 06/17/20  1:38 PM   Specimen: Urine, Random  Result Value Ref Range Status   Specimen Description   Final    URINE, RANDOM Performed at [facility], [address] [place], [place] [address]    Special Requests   Final    NONE Performed at [facility], [address] [place], [place] [address]    Culture >=100,000 COLONIES/mL ESCHERICHIA COLI (A)  Final    Report Status 06/20/2020 FINAL  Final   Organism ID, Bacteria ESCHERICHIA COLI (A)  Final      Susceptibility   Escherichia coli - MIC*    AMPICILLIN <=2 SENSITIVE Sensitive     CEFAZOLIN <=4 SENSITIVE Sensitive     CEFEPIME <=0.12 SENSITIVE Sensitive     CEFTRIAXONE <=0.25 SENSITIVE Sensitive     CIPROFLOXACIN <=0.25 SENSITIVE Sensitive     GENTAMICIN <=1 SENSITIVE Sensitive     IMIPENEM <=0.25 SENSITIVE Sensitive     NITROFURANTOIN <=16 SENSITIVE Sensitive     TRIMETH/SULFA <=20 SENSITIVE Sensitive     AMPICILLIN/SULBACTAM <=2 SENSITIVE Sensitive     PIP/TAZO <=4 SENSITIVE Sensitive     * >=100,000 COLONIES/mL ESCHERICHIA COLI  Culture, blood (Routine x 2)     Status: Abnormal   Collection Time: 06/17/20  1:42 PM   Specimen: BLOOD  Result Value Ref Range Status   Specimen Description   Final    BLOOD BLOOD LEFT WRIST Performed at [facility], [address], [place], [place] [address]    Special Requests   Final    BOTTLES DRAWN AEROBIC AND ANAEROBIC Blood Culture results may not be optimal due to an inadequate volume of blood received in culture bottles Performed at [facility], [address] [place], [place] [address]    Culture  Setup Time   Final    GRAM NEGATIVE RODS IN BOTH AEROBIC AND ANAEROBIC BOTTLES CRITICAL RESULT CALLED TO, READ BACK BY AND VERIFIED WITH: ANGELA ROBBINS AT 1093 06/18/20.PMF Performed at [facility], [place] [address] [place],  [address]    Culture ESCHERICHIA COLI (A)  Final   Report Status 06/20/2020 FINAL  Final   Organism ID, Bacteria ESCHERICHIA COLI  Final      Susceptibility   Escherichia coli - MIC*    AMPICILLIN <=2 SENSITIVE Sensitive     CEFAZOLIN <=4 SENSITIVE Sensitive     CEFEPIME <=0.12 SENSITIVE Sensitive     CEFTAZIDIME <=1 SENSITIVE Sensitive     CEFTRIAXONE <=0.25 SENSITIVE Sensitive     CIPROFLOXACIN <=0.25 SENSITIVE Sensitive     GENTAMICIN <=1 SENSITIVE Sensitive     IMIPENEM <=0.25  SENSITIVE Sensitive     TRIMETH/SULFA <=20 SENSITIVE Sensitive     AMPICILLIN/SULBACTAM <=2 SENSITIVE Sensitive     PIP/TAZO <=4 SENSITIVE Sensitive     * ESCHERICHIA COLI  Blood Culture ID Panel (Reflexed)     Status: Abnormal   Collection Time: 06/17/20  1:42 PM  Result Value Ref Range Status   Enterococcus faecalis NOT DETECTED NOT DETECTED Final   Enterococcus Faecium NOT DETECTED NOT DETECTED Final   Listeria monocytogenes NOT DETECTED NOT DETECTED Final   Staphylococcus species NOT DETECTED NOT DETECTED Final   Staphylococcus aureus (BCID) NOT DETECTED NOT DETECTED Final   Staphylococcus epidermidis NOT DETECTED NOT DETECTED Final   Staphylococcus lugdunensis NOT DETECTED NOT DETECTED Final   Streptococcus species NOT DETECTED NOT DETECTED Final   Streptococcus agalactiae NOT DETECTED NOT DETECTED Final   Streptococcus pneumoniae NOT DETECTED NOT DETECTED Final   Streptococcus pyogenes NOT DETECTED NOT DETECTED Final   A.calcoaceticus-baumannii NOT DETECTED NOT DETECTED Final   Bacteroides fragilis NOT DETECTED NOT DETECTED Final   Enterobacterales DETECTED (A) NOT DETECTED Final    Comment: Enterobacterales represent a large order of gram negative bacteria, not a single organism. CRITICAL RESULT CALLED TO, READ BACK BY AND VERIFIED WITH: ANGELA ROBBINS AT 9381 06/18/20.PMF    Enterobacter cloacae complex NOT DETECTED NOT DETECTED Final   Escherichia coli DETECTED (A) NOT DETECTED Final    Comment: CRITICAL RESULT CALLED TO, READ BACK BY AND VERIFIED WITH: ANGELA ROBBINS AT 8299 06/18/20.PMF    Klebsiella aerogenes NOT DETECTED NOT DETECTED Final   Klebsiella oxytoca NOT DETECTED NOT DETECTED Final   Klebsiella pneumoniae NOT DETECTED NOT DETECTED Final   Proteus species NOT DETECTED NOT DETECTED Final   Salmonella species NOT DETECTED NOT DETECTED Final   Serratia marcescens NOT DETECTED NOT DETECTED Final   Haemophilus influenzae NOT DETECTED NOT DETECTED Final   Neisseria  meningitidis NOT DETECTED NOT DETECTED Final   Pseudomonas aeruginosa NOT DETECTED NOT DETECTED Final   Stenotrophomonas maltophilia NOT DETECTED NOT DETECTED Final   Candida albicans NOT DETECTED NOT DETECTED Final   Candida auris NOT DETECTED NOT DETECTED Final   Candida glabrata NOT DETECTED NOT DETECTED Final   Candida krusei NOT DETECTED NOT DETECTED Final   Candida parapsilosis NOT DETECTED NOT DETECTED Final   Candida tropicalis NOT DETECTED NOT DETECTED Final   Cryptococcus neoformans/gattii NOT DETECTED NOT DETECTED Final   CTX-M ESBL NOT DETECTED NOT DETECTED Final   Carbapenem resistance IMP NOT DETECTED NOT DETECTED Final   Carbapenem resistance KPC NOT DETECTED NOT DETECTED Final   Carbapenem resistance NDM NOT DETECTED NOT DETECTED Final   Carbapenem resist OXA 48 LIKE NOT DETECTED NOT DETECTED Final   Carbapenem resistance VIM NOT DETECTED NOT DETECTED Final    Comment: Performed at [facility], [address] [place], [place] [address]  Resp Panel by RT-PCR (Flu A&B, Covid) Nasopharyngeal Swab     Status: None   Collection Time: 06/17/20  2:54 PM   Specimen: Nasopharyngeal Swab; Nasopharyngeal(NP) swabs in vial transport medium  Result Value Ref Range Status   SARS Coronavirus 2 by RT PCR NEGATIVE NEGATIVE Final    Comment: (NOTE) SARS-CoV-2 target nucleic acids are NOT DETECTED.  The SARS-CoV-2 RNA is generally detectable in upper respiratory specimens during the acute phase of infection. The lowest concentration of SARS-CoV-2 viral copies this assay can detect is 138 copies/mL. A negative result does not preclude SARS-Cov-2 infection and should not be used as the sole basis for  treatment or other patient management decisions. A negative result may occur with  improper specimen collection/handling, submission of specimen other than nasopharyngeal swab, presence of viral mutation(s) within the areas targeted by this assay, and inadequate number of  viral copies(<138 copies/mL). A negative result must be combined with clinical observations, patient history, and epidemiological information. The expected result is Negative.  Fact Sheet for Patients:  EntrepreneurPulse.com.au  Fact Sheet for Healthcare Providers:  IncredibleEmployment.be  This test is no t yet approved or cleared by the [place] FDA and  has been authorized for detection and/or diagnosis of SARS-CoV-2 by FDA under an Emergency Use Authorization (EUA). This EUA will remain  in effect (meaning this test can be used) for the duration of the COVID-19 declaration under Section 564(b)(1) of the Act, 21 U.S.C.section 360bbb-3(b)(1), unless the authorization is terminated  or revoked sooner.       Influenza A by PCR NEGATIVE NEGATIVE Final   Influenza B by PCR NEGATIVE NEGATIVE Final    Comment: (NOTE) The Xpert Xpress SARS-CoV-2/FLU/RSV plus assay is intended as an aid in the diagnosis of influenza from Nasopharyngeal swab specimens and should not be used as a sole basis for treatment. Nasal washings and aspirates are unacceptable for Xpert Xpress SARS-CoV-2/FLU/RSV testing.  Fact Sheet for Patients: EntrepreneurPulse.com.au  Fact Sheet for Healthcare Providers: IncredibleEmployment.be  This test is not yet approved or cleared by the [place] FDA and has been authorized for detection and/or diagnosis of SARS-CoV-2 by FDA under an Emergency Use Authorization (EUA). This EUA will remain in effect (meaning this test can be used) for the duration of the COVID-19 declaration under Section 564(b)(1) of the Act, 21 U.S.C. section 360bbb-3(b)(1), unless the authorization is terminated or revoked.  Performed at [facility], [address] [place], [place] [address]   Blood culture (single)     Status: None (Preliminary result)   Collection Time: 06/18/20  9:03 AM   Specimen:  BLOOD LEFT WRIST  Result Value Ref Range Status   Specimen Description BLOOD LEFT WRIST  Final   Special Requests   Final    BOTTLES DRAWN AEROBIC AND ANAEROBIC Blood Culture adequate volume   Culture   Final    NO GROWTH 3 DAYS Performed at [facility], [address] [place], [place] [address]    Report Status PENDING  Incomplete  Resp Panel by RT-PCR (Flu A&B, Covid) Nasopharyngeal Swab     Status: None   Collection Time: 06/18/20  9:55 AM   Specimen: Nasopharyngeal Swab; Nasopharyngeal(NP) swabs in vial transport medium  Result Value Ref Range Status   SARS Coronavirus 2 by RT PCR NEGATIVE NEGATIVE Final    Comment: (NOTE) SARS-CoV-2 target nucleic acids are NOT DETECTED.  The SARS-CoV-2 RNA is generally detectable in upper respiratory specimens during the acute phase of infection. The lowest concentration of SARS-CoV-2 viral copies this assay can detect is 138 copies/mL. A negative result does not preclude SARS-Cov-2 infection and should not be used as the sole basis for treatment or other patient management decisions. A negative result may occur with  improper specimen collection/handling, submission of specimen other than nasopharyngeal swab, presence of viral mutation(s) within the areas targeted by this assay, and inadequate number of viral copies(<138 copies/mL). A negative result must be combined with clinical observations, patient history, and epidemiological information. The expected result is Negative.  Fact Sheet for Patients:  EntrepreneurPulse.com.au  Fact Sheet for Healthcare Providers:  IncredibleEmployment.be  This test is no t  yet approved or cleared by the [place] and  has been authorized for detection and/or diagnosis of SARS-CoV-2 by FDA under an Emergency Use Authorization (EUA). This EUA will remain  in effect (meaning this test can be used) for the duration of the COVID-19 declaration under  Section 564(b)(1) of the Act, 21 U.S.C.section 360bbb-3(b)(1), unless the authorization is terminated  or revoked sooner.       Influenza A by PCR NEGATIVE NEGATIVE Final   Influenza B by PCR NEGATIVE NEGATIVE Final    Comment: (NOTE) The Xpert Xpress SARS-CoV-2/FLU/RSV plus assay is intended as an aid in the diagnosis of influenza from Nasopharyngeal swab specimens and should not be used as a sole basis for treatment. Nasal washings and aspirates are unacceptable for Xpert Xpress SARS-CoV-2/FLU/RSV testing.  Fact Sheet for Patients: EntrepreneurPulse.com.au  Fact Sheet for Healthcare Providers: IncredibleEmployment.be  This test is not yet approved or cleared by the [place] FDA and has been authorized for detection and/or diagnosis of SARS-CoV-2 by FDA under an Emergency Use Authorization (EUA). This EUA will remain in effect (meaning this test can be used) for the duration of the COVID-19 declaration under Section 564(b)(1) of the Act, 21 U.S.C. section 360bbb-3(b)(1), unless the authorization is terminated or revoked.  Performed at [facility], [address] [place], [place] [address]          Radiology Studies: No results found.      Scheduled Meds: . allopurinol  100 mg Oral Daily  . cefdinir  300 mg Oral Q12H  . Chlorhexidine Gluconate Cloth  6 each Topical Daily  . famotidine  20 mg Oral BID  . midodrine  5 mg Oral TID WC  . topiramate  25 mg Oral QHS   Continuous Infusions: . sodium chloride       LOS: 3 days    Time spent: 25 minutes    Sidney Ace, MD [facility] Pager [phone]  If 7PM-7AM, please contact night-coverage 06/21/2020, 1:02 PM

## 2020-06-21 NOTE — Progress Notes (Signed)
Mobility Specialist - Progress Note   06/21/20 1200  Mobility  Activity Ambulated in room;Sat and stood x 3  Range of Motion/Exercises Right leg;Left leg (TR, MS)  Level of Assistance Modified independent, requires aide device or extra time  Assistive Device Front wheel walker  Distance Ambulated (ft) 50 ft  Mobility Response Tolerated well  Mobility performed by Mobility specialist  $Mobility charge 1 Mobility     Pre-mobility: 73 HR, 96% SpO2 During mobility: 77 HR, 93% SpO2 Post-mobility: 73 HR, 96% SpO2   Pt was sitting EOB upon arrival with family present. Pt agreed to session. Pt utilizing room air. Pt denied pain, nausea, and fatigue. Pt voices that she would like a RW to take home prior to d/c. CSW notified. Pt stated that she has been feeling weak in LE which has been causing her difficulty to stand from bedside. Pt was able to stand to RW with supervision and extra time. Pt denied dizziness upon standing. Pt ambulated 50' in room independently, stopping to bathroom every 25' for urinal output. Pt states that urinal cath was removed yesterday and she's been having to go very frequently ever since. Pt able to perform hygiene tasks on self. Pt ambulated back to bedside where she participated in standing exercises: toe raises x15, mini squats x10, and STSx5 from various bed heights. Pt needed VC for hand placement to stand. Pt carries over commands well and no extra time needed on any attempt. Pt familiar with therex and mobility encouraged to continue exercises in between sessions to regain/retain strength. Overall, pt tolerated session well. Pt denied SOB throughout session.    Kathee Delton Mobility Specialist 06/21/20, 12:57 PM

## 2020-06-21 NOTE — Progress Notes (Signed)
Physical Therapy Treatment Patient Details Name: Heather Simmons MRN: [id number] DOB: 14-Oct-1950 Today's Date: 06/21/2020    History of Present Illness 70 y.o. female with past medical history of hypertension, gout, and migraines who presented to the ED complaining of R flank pain & abnormal labs. Patient found to have UTI and obstructing right ureteral stone. Now, s/p cystoscopy with right ureteral stent placement 06/18/2020.  Shock physiology improving.  Patient transferred out of ICU to [facility] service on 06/20/2020.    PT Comments    Pt is making good progress towards goals with ability to ambulate in hallway without assistance. RW delivered to room and adjusted, used for mobility training. Demonstrates good safety awareness and maintained O2 sats with exertion. Is hopeful for discharge soon. Will continue to progress.   Follow Up Recommendations  Home health PT     Equipment Recommendations  Rolling walker with 5" wheels    Recommendations for Other Services       Precautions / Restrictions Precautions Precautions: Fall Restrictions Weight Bearing Restrictions: No    Mobility  Bed Mobility Overal bed mobility: Needs Assistance Bed Mobility: Supine to Sit     Supine to sit: Supervision     General bed mobility comments: cues for bed control. Once seated, upright posture noted    Transfers Overall transfer level: Needs assistance Equipment used: Rolling walker (2 wheeled) Transfers: Sit to/from Stand Sit to Stand: Supervision         General transfer comment: safe technique with use of RW  Ambulation/Gait Ambulation/Gait assistance: Supervision Gait Distance (Feet): 200 Feet Assistive device: Rolling walker (2 wheeled) Gait Pattern/deviations: Step-through pattern     General Gait Details: ambulated around RN station with reciprocal gait pattern. Slight decreased step length on L LE. Able to carry conversation during gait training with O2 sats monitored throughout  raning 91-94% on RA with exertion. 1 standing rest break needed   Stairs             Wheelchair Mobility    Modified Rankin (Stroke Patients Only)       Balance Overall balance assessment: Needs assistance Sitting-balance support: No upper extremity supported;Feet supported Sitting balance-Leahy Scale: Good     Standing balance support: No upper extremity supported Standing balance-Leahy Scale: Fair                              Cognition Arousal/Alertness: Awake/alert Behavior During Therapy: WFL for tasks assessed/performed Overall Cognitive Status: Within Functional Limits for tasks assessed                                        Exercises Other Exercises Other Exercises: able to ambulate in/out of bathroom x 2 with urinary urgency. Able to transfer on/off toliet with use of railing and stand at sink without UE support for hygiene.    General Comments        Pertinent Vitals/Pain Pain Assessment: No/denies pain    Home Living                      Prior Function            PT Goals (current goals can now be found in the care plan section) Acute Rehab PT Goals Patient Stated Goal: to return home PT Goal Formulation: With patient/family Time For Goal Achievement:  07/04/20 Potential to Achieve Goals: Good Progress towards PT goals: Progressing toward goals    Frequency    Min 2X/week      PT Plan Current plan remains appropriate    Co-evaluation              AM-PAC PT "6 Clicks" Mobility   Outcome Measure  Help needed turning from your back to your side while in a flat bed without using bedrails?: None Help needed moving from lying on your back to sitting on the side of a flat bed without using bedrails?: None Help needed moving to and from a bed to a chair (including a wheelchair)?: None Help needed standing up from a chair using your arms (e.g., wheelchair or bedside chair)?: None Help needed to  walk in hospital room?: None Help needed climbing 3-5 steps with a railing? : A Little 6 Click Score: 23    End of Session   Activity Tolerance: Patient tolerated treatment well Patient left: in bed Nurse Communication: Mobility status PT Visit Diagnosis: Muscle weakness (generalized) (M62.81);Difficulty in walking, not elsewhere classified (R26.2)     Time: 7096-2836 PT Time Calculation (min) (ACUTE ONLY): 23 min  Charges:  $Gait Training: 8-22 mins $Therapeutic Activity: 8-22 mins                     Greggory Stallion, PT, DPT [phone]    Heather Simmons 06/21/2020, 3:14 PM

## 2020-06-21 NOTE — Care Management Important Message (Signed)
Important Message  Patient Details  Name: Heather Simmons MRN: [id number] Date of Birth: November 21, 1950   Medicare Important Message Given:  Yes     Dannette Barbara 06/21/2020, 12:06 PM

## 2020-06-22 DIAGNOSIS — N10 Acute tubulo-interstitial nephritis: Secondary | ICD-10-CM | POA: Diagnosis not present

## 2020-06-22 LAB — CBC WITH DIFFERENTIAL/PLATELET
Abs Immature Granulocytes: 3.15 10*3/uL — ABNORMAL HIGH (ref 0.00–0.07)
Basophils Absolute: 0 10*3/uL (ref 0.0–0.1)
Basophils Relative: 0 %
Eosinophils Absolute: 0.2 10*3/uL (ref 0.0–0.5)
Eosinophils Relative: 2 %
HCT: 37.6 % (ref 36.0–46.0)
Hemoglobin: 12.3 g/dL (ref 12.0–15.0)
Immature Granulocytes: 22 %
Lymphocytes Relative: 18 %
Lymphs Abs: 2.6 10*3/uL (ref 0.7–4.0)
MCH: 31.5 pg (ref 26.0–34.0)
MCHC: 32.7 g/dL (ref 30.0–36.0)
MCV: 96.4 fL (ref 80.0–100.0)
Monocytes Absolute: 1.2 10*3/uL — ABNORMAL HIGH (ref 0.1–1.0)
Monocytes Relative: 8 %
Neutro Abs: 7.4 10*3/uL (ref 1.7–7.7)
Neutrophils Relative %: 50 %
Platelets: 123 10*3/uL — ABNORMAL LOW (ref 150–400)
RBC: 3.9 MIL/uL (ref 3.87–5.11)
RDW: 13.1 % (ref 11.5–15.5)
Smear Review: NORMAL
WBC: 14.7 10*3/uL — ABNORMAL HIGH (ref 4.0–10.5)
nRBC: 0.8 % — ABNORMAL HIGH (ref 0.0–0.2)

## 2020-06-22 LAB — BASIC METABOLIC PANEL
Anion gap: 9 (ref 5–15)
BUN: 26 mg/dL — ABNORMAL HIGH (ref 8–23)
CO2: 25 mmol/L (ref 22–32)
Calcium: 8.3 mg/dL — ABNORMAL LOW (ref 8.9–10.3)
Chloride: 109 mmol/L (ref 98–111)
Creatinine, Ser: 1.25 mg/dL — ABNORMAL HIGH (ref 0.44–1.00)
GFR, Estimated: 46 mL/min — ABNORMAL LOW (ref >60)
Glucose, Bld: 85 mg/dL (ref 70–99)
Potassium: 3.4 mmol/L — ABNORMAL LOW (ref 3.5–5.1)
Sodium: 143 mmol/L (ref 135–145)

## 2020-06-22 MED ORDER — CIPROFLOXACIN HCL 500 MG PO TABS: 500.0000 mg | ORAL_TABLET | Freq: Two times a day (BID) | ORAL | 0 refills | Status: AC

## 2020-06-22 MED ORDER — POTASSIUM CHLORIDE CRYS ER 20 MEQ PO TBCR
40.0000 meq | EXTENDED_RELEASE_TABLET | Freq: Once | ORAL | Status: AC
  Administered 2020-06-22: 40 meq via ORAL
  Filled 2020-06-22: qty 2

## 2020-06-22 NOTE — Progress Notes (Signed)
Heather Simmons to be D/C'd Home per MD order.  Discussed prescriptions and follow up appointments with the patient. Prescriptions given to patient, medication list explained in detail. Pt verbalized understanding.  Allergies as of 06/22/2020      Reactions   Lisinopril Other (See Comments)   Intolerance - HAs      Medication List    TAKE these medications   allopurinol 100 MG tablet Commonly known as: ZYLOPRIM Take 100 mg by mouth daily.   amLODipine 10 MG tablet Commonly known as: NORVASC Take 10 mg by mouth at bedtime. HS   carvedilol 6.25 MG tablet Commonly known as: COREG Take 6.25 mg by mouth 2 (two) times daily with a meal.   cefdinir 300 MG capsule Commonly known as: OMNICEF Take 1 capsule (300 mg total) by mouth 2 (two) times daily for 5 days.   ciprofloxacin 500 MG tablet Commonly known as: Cipro Take 1 tablet (500 mg total) by mouth 2 (two) times daily for 12 days.   losartan 100 MG tablet Commonly known as: COZAAR Take 100 mg by mouth at bedtime.   ondansetron 4 MG disintegrating tablet Commonly known as: Zofran ODT Take 1 tablet (4 mg total) by mouth every 8 (eight) hours as needed for nausea or vomiting.   topiramate 25 MG tablet Commonly known as: TOPAMAX Take 25 mg by mouth at bedtime. HS            Durable Medical Equipment  (From admission, onward)         Start     Ordered   06/21/20 1411  For home use only DME Walker rolling  Once       Question Answer Comment  Walker: With 5 Inch Wheels   Patient needs a walker to treat with the following condition Weakness      06/21/20 1411          Vitals:   06/22/20 0453 06/22/20 1131  BP: 134/67 132/69  Pulse: 72 60  Resp: 20 18  Temp: 98 F (36.7 C) 99.3 F (37.4 C)  SpO2: 91% 95%    Skin clean, dry and intact without evidence of skin break down, no evidence of skin tears noted. IV catheter discontinued intact. Site without signs and symptoms of complications. Dressing and pressure  applied. Pt denies pain at this time. No complaints noted.  An After Visit Summary was printed and given to the patient. Patient escorted via [place], and D/C home via private auto.  Fuller Mandril, RN

## 2020-06-22 NOTE — TOC Transition Note (Signed)
Transition of Care [facility]) - CM/SW Discharge Note   Patient Details  Name: Heather Simmons MRN: [id number] Date of Birth: 1951-05-04  [facility] CM/SW Contact:  Beverly Sessions, RN Phone Number: 06/22/2020, 2:05 PM   Clinical Narrative:     Patient to discharge today Joelene Millin with [facility] home health notified of discharge  Sister to transport at discharge    Final next level of care: [place] Barriers to Discharge: No Barriers Identified   Patient Goals and CMS Choice        Discharge Placement                       Discharge Plan and Services   Discharge Planning Services: CM Consult            DME Arranged: Gilford Rile rolling DME Agency: [employer] Date DME Agency Contacted: 06/21/20   Representative spoke with at DME Agency: [place]: PT,OT [place] Date [place]: 06/21/20   Representative spoke with at [place]: [place] ([place]) Interventions     Readmission Risk Interventions No flowsheet data found.

## 2020-06-22 NOTE — Plan of Care (Signed)
  Problem: Health Behavior/Discharge Planning: Goal: Ability to manage health-related needs will improve Outcome: Progressing   Problem: Elimination: Goal: Will not experience complications related to urinary retention Outcome: Progressing   Problem: Safety: Goal: Ability to remain free from injury will improve Outcome: Progressing   

## 2020-06-22 NOTE — Discharge Summary (Signed)
Physician Discharge Summary  DILAN FULLENWIDER VVO:[id number] DOB: Jan 15, 1951 DOA: 06/18/2020  PCP: Dion Body, MD  Admit date: 06/18/2020 Discharge date: 06/22/2020  Admitted From: Home Disposition: Home  Recommendations for Outpatient Follow-up:  1. Follow up with PCP in 1-2 weeks 2. Please obtain BMP/CBC in one week your next doctors visit.  3. Oral Cipro twice daily for next 12 days 4. Outpatient urology follow-up, to be arranged by their service  Discharge Condition: Stable CODE STATUS: Full code Diet recommendation: 2 g salt  Brief/Interim Summary: 70 y.o.femalewith past medical history of hypertension, gout, and migraines who presents to the ED complaining of abnormal labs. Patient was seen in the ED yesterday for 24 hours of generalized weakness, urinary frequency, and lower abdominal discomfort.  + diagnosed with a UTI at that time and started on cefdinir.  She states that she had a hard time sleeping last night and developed chills this morning felt like she was feverish but has not taken her temperature.  Found to have obstructing right ureteral stone.  Status post cystoscopy with right ureteral stent placement 06/18/2020.  Shock physiology improving.  Patient transferred out of ICU to [facility] service on 06/20/2020.  Blood pressures remained stable.  Seen by urology service in follow-up.  On the day of discharge patient did not have any complaints overall she feels well. Today she is stable for discharge   Body mass index is 46.89 kg/m.     Septic shock secondary to E. coli bacteremia in the setting of obstructive right ureteral stone status post stent Blood culture positive E. coli 06/17/2020 Sepsis physiology has resolved. Patient is to be transitioned to oral Cipro for 12 more days. Will need to follow-up outpatient urology to be arranged by their service. Continue oral hydration at home.  Essential hypertension Resume home medications  Gout Continue home  allopurinol  History of migraines Continue on Topamax     Discharge Diagnoses:  Principal Problem:   Acute pyelonephritis Active Problems:   Obstructive nephropathy   HTN (hypertension)   Gout   Migraine   Septic shock [facility])      Consultations: Urology Subjective: Patient feels okay no complaints. She states she is ready to go home.  Discharge Exam: Vitals:   06/22/20 0453 06/22/20 1131  BP: 134/67 132/69  Pulse: 72 60  Resp: 20 18  Temp: 98 F (36.7 C) 99.3 F (37.4 C)  SpO2: 91% 95%   Vitals:   06/21/20 1532 06/21/20 1950 06/22/20 0453 06/22/20 1131  BP: (!) 155/75 (!) 149/82 134/67 132/69  Pulse: (!) 59 65 72 60  Resp: 20 20 20 18   Temp: 98.5 F (36.9 C) 98.6 F (37 C) 98 F (36.7 C) 99.3 F (37.4 C)  TempSrc: Oral Oral Oral Oral  SpO2: 95% 94% 91% 95%  Weight:        General: Pt is alert, awake, not in acute distress Cardiovascular: RRR, S1/S2 +, no rubs, no gallops Respiratory: CTA bilaterally, no wheezing, no rhonchi Abdominal: Soft, NT, ND, bowel sounds + Extremities: no edema, no cyanosis  Discharge Instructions   Allergies as of 06/22/2020      Reactions   Lisinopril Other (See Comments)   Intolerance - HAs      Medication List    TAKE these medications   allopurinol 100 MG tablet Commonly known as: ZYLOPRIM Take 100 mg by mouth daily.   amLODipine 10 MG tablet Commonly known as: NORVASC Take 10 mg by mouth at bedtime. HS  carvedilol 6.25 MG tablet Commonly known as: COREG Take 6.25 mg by mouth 2 (two) times daily with a meal.   cefdinir 300 MG capsule Commonly known as: OMNICEF Take 1 capsule (300 mg total) by mouth 2 (two) times daily for 5 days.   ciprofloxacin 500 MG tablet Commonly known as: Cipro Take 1 tablet (500 mg total) by mouth 2 (two) times daily for 12 days.   losartan 100 MG tablet Commonly known as: COZAAR Take 100 mg by mouth at bedtime.   ondansetron 4 MG disintegrating tablet Commonly known  as: Zofran ODT Take 1 tablet (4 mg total) by mouth every 8 (eight) hours as needed for nausea or vomiting.   topiramate 25 MG tablet Commonly known as: TOPAMAX Take 25 mg by mouth at bedtime. HS            Durable Medical Equipment  (From admission, onward)         Start     Ordered   06/21/20 1411  For home use only DME Walker rolling  Once       Question Answer Comment  Walker: With 5 Inch Wheels   Patient needs a walker to treat with the following condition Weakness      06/21/20 1411          Follow-up Information    [facility].   Why: The office will call to arrange f/u in 1-2 weeks.        Dion Body, MD. Schedule an appointment as soon as possible for a visit in 1 week(s).   Specialty: Family Medicine Contact information: [place] [address] [phone]              Allergies  Allergen Reactions  . Lisinopril Other (See Comments)    Intolerance - HAs    You were cared for by a hospitalist during your hospital stay. If you have any questions about your discharge medications or the care you received while you were in the hospital after you are discharged, you can call the unit and asked to speak with the hospitalist on call if the hospitalist that took care of you is not available. Once you are discharged, your primary care physician will handle any further medical issues. Please note that no refills for any discharge medications will be authorized once you are discharged, as it is imperative that you return to your primary care physician (or establish a relationship with a primary care physician if you do not have one) for your aftercare needs so that they can reassess your need for medications and monitor your lab values.   Procedures/Studies: DG Chest 2 View  Result Date: 06/17/2020 CLINICAL DATA:  Abdominal pain and urinary frequency. EXAM: CHEST - 2 VIEW COMPARISON:   01/28/2007 FINDINGS: The heart is enlarged. Mild tortuosity of the thoracic aorta. The pulmonary hila appear unremarkable. No acute pulmonary findings. No infiltrates or effusions. No worrisome pulmonary lesions. The bony thorax is intact. IMPRESSION: Mild cardiac enlargement but no acute pulmonary findings. Electronically Signed   By: Marijo Sanes M.D.   On: 06/17/2020 14:37   DG Chest Port 1 View  Result Date: 06/19/2020 CLINICAL DATA:  Assess for interval change. Admitted patient with acute pyelonephritis. EXAM: PORTABLE CHEST 1 VIEW COMPARISON:  Radiograph 06/17/2020 FINDINGS: Stable lung volumes. Stable cardiomegaly. Unchanged mediastinal contours. No focal airspace disease. No pulmonary edema. No pleural fluid or pneumothorax. No acute osseous abnormalities are seen. IMPRESSION: No  significant interval change.  Stable cardiomegaly. Electronically Signed   By: Keith Rake M.D.   On: 06/19/2020 03:47   DG OR UROLOGY CYSTO IMAGE ([place])  Result Date: 06/18/2020 There is no interpretation for this exam.  This order is for images obtained during a surgical procedure.  Please See "Surgeries" Tab for more information regarding the procedure.   CT Renal Stone Study  Result Date: 06/18/2020 CLINICAL DATA:  Abdominal pain with nausea and vomiting EXAM: CT ABDOMEN AND PELVIS WITHOUT CONTRAST TECHNIQUE: Multidetector CT imaging of the abdomen and pelvis was performed following the standard protocol without oral or IV contrast. COMPARISON:  None. FINDINGS: Lower chest: Lung bases are clear. Hepatobiliary: There is hepatic steatosis. No focal liver lesions are evident on this noncontrast enhanced study. There are laminated gallstones within the gallbladder. The gallbladder wall does not appear thickened. There is no appreciable biliary duct dilatation. Pancreas: There is no appreciable pancreatic mass or inflammatory focus. Fatty infiltration is noted in portions of the head of the pancreas. Spleen: No  splenic lesions are evident. Adrenals/Urinary Tract: Adrenals bilaterally appear normal. The right kidney is edematous. There is a suspected parapelvic cyst on the right measuring 3.5 x 2.7 cm. There is also mild hydronephrosis on the right. There is a calculus in the lower pole of the right kidney in the lateral pelvic region measuring 1.1 x 0.9 cm. No other renal calculi are evident. There is subtle edema around the proximal right ureter. No ureteral calculus is evident. There is perinephric soft tissue thickening on the right with thickening of the mesentery in the right mid abdomen extending inferiorly as well as medial and lateral to the right kidney. No well-defined abscess seen in this area. There is a calculus at the right ureterovesical junction measuring 4 x 3 mm. There is thickening of the wall of the urinary bladder. Stomach/Bowel: There is no appreciable bowel wall or mesenteric thickening. Loops of small bowel extend into a pelvic midline ventral hernia without demonstrable bowel compromise. There is no appreciable bowel obstruction. Terminal ileum appears normal. There is no evident free air or portal venous air. Vascular/Lymphatic: There are foci of aortic atherosclerosis. No adenopathy is evident in the abdomen or pelvis. Reproductive: Uterus is absent.  No adnexal mass lesions evident. Other: No periappendiceal region inflammation. No ascites or abscess in the abdomen pelvis. As stated above, there is a midline pelvic ventral hernia containing loops of small bowel. This hernia at its neck measures 3.8 cm from right to left dimension and 2.7 cm from superior to inferior dimension. There is a hernia along the left lower quadrant abdominal wall which contains fat but no bowel. This hernia at its neck measures 5.1 cm from right to left dimension and 3.1 cm from superior to inferior dimension. There is a smaller midline ventral hernia near the umbilicus which contains fat but no bowel. This hernia  measures 1.5 cm from right to left dimension at its neck and 2.6 cm from superior to inferior dimension at its neck. There are areas of scarring in the anterior lower abdominal and pelvic wall regions. Musculoskeletal: There is degenerative change in the lower thoracic and lumbar regions. 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 There is spinal stenosis at L2-3, L3-4, and L4-5 due to disc protrusion and bony hypertrophy. No blastic or lytic bone lesions. No intramuscular lesions are evident. IMPRESSION: 1. Mild hydronephrosis on the right. There is a 4 x 3 mm calculus at the right ureterovesical junction. The right  kidney is edematous with soft tissue stranding and thickening throughout perinephric fascia on the right. Focal right renal abscess. The changes in the mesentery on the right may be due to the ureteral calculus but also could be indicative of a degree of pyelonephritis on the right. There is also a calculus in the lower pole right kidney measuring 1.1 x 0.9 cm. 2. Thickening of the urinary bladder wall, a finding likely indicative of a degree of cystitis. 3.  Cholelithiasis.  No gallbladder wall thickening. 4. Ventral hernias as noted. There are loops of small bowel in a midline ventral pelvic hernia without bowel compromise. Other hernias containing fat but no bowel. 5. Multilevel spinal stenosis due to disc protrusion and bony hypertrophy. 6.  Aortic Atherosclerosis (ICD10-I70.0). 7.  Hepatic steatosis. 8.  Uterus absent. Electronically Signed   By: Lowella Grip III M.D.   On: 06/18/2020 09:47      The results of significant diagnostics from this hospitalization (including imaging, microbiology, ancillary and laboratory) are listed below for reference.     Microbiology: Recent Results (from the past 240 hour(s))  Culture, blood (Routine x 2)     Status: Abnormal   Collection Time: 06/17/20  1:38 PM   Specimen: BLOOD  Result Value Ref Range Status   Specimen Description   Final    BLOOD RIGHT  ANTECUBITAL Performed at [facility], [address], [place], [place] [address]    Special Requests   Final    BOTTLES DRAWN AEROBIC AND ANAEROBIC Blood Culture adequate volume Performed at [facility], [address] [place], [place] [address]    Culture  Setup Time   Final    GRAM NEGATIVE RODS IN BOTH AEROBIC AND ANAEROBIC BOTTLES CRITICAL RESULT CALLED TO, READ BACK BY AND VERIFIED WITH: ANGELA ROBBINS AT 0640 06/18/20.PMF Performed at [facility], [address] [place], [place] [address]    Culture (A)  Final    ESCHERICHIA COLI SUSCEPTIBILITIES PERFORMED ON PREVIOUS CULTURE WITHIN THE LAST 5 DAYS. Performed at [facility], [place] [address], [place], [place] [address]    Report Status 06/20/2020 FINAL  Final  Urine Culture     Status: Abnormal   Collection Time: 06/17/20  1:38 PM   Specimen: Urine, Random  Result Value Ref Range Status   Specimen Description   Final    URINE, RANDOM Performed at [facility], [address] [place], [place] [address]    Special Requests   Final    NONE Performed at [facility], [place], [place] [address]    Culture >=100,000 COLONIES/mL ESCHERICHIA COLI (A)  Final   Report Status 06/20/2020 FINAL  Final   Organism ID, Bacteria ESCHERICHIA COLI (A)  Final      Susceptibility   Escherichia coli - MIC*    AMPICILLIN <=2 SENSITIVE Sensitive     CEFAZOLIN <=4 SENSITIVE Sensitive     CEFEPIME <=0.12 SENSITIVE Sensitive     CEFTRIAXONE <=0.25 SENSITIVE Sensitive     CIPROFLOXACIN <=0.25 SENSITIVE Sensitive     GENTAMICIN <=1 SENSITIVE Sensitive     IMIPENEM <=0.25 SENSITIVE Sensitive     NITROFURANTOIN <=16 SENSITIVE Sensitive     TRIMETH/SULFA <=20 SENSITIVE Sensitive     AMPICILLIN/SULBACTAM <=2 SENSITIVE Sensitive     PIP/TAZO <=4 SENSITIVE Sensitive     * >=100,000 COLONIES/mL ESCHERICHIA COLI  Culture, blood (Routine x 2)     Status: Abnormal    Collection Time:  06/17/20  1:42 PM   Specimen: BLOOD  Result Value Ref Range Status   Specimen Description   Final    BLOOD BLOOD LEFT WRIST Performed at [facility], [address] [place], [place] [address]    Special Requests   Final    BOTTLES DRAWN AEROBIC AND ANAEROBIC Blood Culture results may not be optimal due to an inadequate volume of blood received in culture bottles Performed at [facility], [address], [place], [place] [address]    Culture  Setup Time   Final    GRAM NEGATIVE RODS IN BOTH AEROBIC AND ANAEROBIC BOTTLES CRITICAL RESULT CALLED TO, READ BACK BY AND VERIFIED WITH: ANGELA ROBBINS AT 0640 06/18/20.PMF Performed at [facility], [place] [address], [place], [place] [address]    Culture ESCHERICHIA COLI (A)  Final   Report Status 06/20/2020 FINAL  Final   Organism ID, Bacteria ESCHERICHIA COLI  Final      Susceptibility   Escherichia coli - MIC*    AMPICILLIN <=2 SENSITIVE Sensitive     CEFAZOLIN <=4 SENSITIVE Sensitive     CEFEPIME <=0.12 SENSITIVE Sensitive     CEFTAZIDIME <=1 SENSITIVE Sensitive     CEFTRIAXONE <=0.25 SENSITIVE Sensitive     CIPROFLOXACIN <=0.25 SENSITIVE Sensitive     GENTAMICIN <=1 SENSITIVE Sensitive     IMIPENEM <=0.25 SENSITIVE Sensitive     TRIMETH/SULFA <=20 SENSITIVE Sensitive     AMPICILLIN/SULBACTAM <=2 SENSITIVE Sensitive     PIP/TAZO <=4 SENSITIVE Sensitive     * ESCHERICHIA COLI  Blood Culture ID Panel (Reflexed)     Status: Abnormal   Collection Time: 06/17/20  1:42 PM  Result Value Ref Range Status   Enterococcus faecalis NOT DETECTED NOT DETECTED Final   Enterococcus Faecium NOT DETECTED NOT DETECTED Final   Listeria monocytogenes NOT DETECTED NOT DETECTED Final   Staphylococcus species NOT DETECTED NOT DETECTED Final   Staphylococcus aureus (BCID) NOT DETECTED NOT DETECTED Final   Staphylococcus epidermidis NOT DETECTED NOT DETECTED Final   Staphylococcus lugdunensis NOT DETECTED NOT  DETECTED Final   Streptococcus species NOT DETECTED NOT DETECTED Final   Streptococcus agalactiae NOT DETECTED NOT DETECTED Final   Streptococcus pneumoniae NOT DETECTED NOT DETECTED Final   Streptococcus pyogenes NOT DETECTED NOT DETECTED Final   A.calcoaceticus-baumannii NOT DETECTED NOT DETECTED Final   Bacteroides fragilis NOT DETECTED NOT DETECTED Final   Enterobacterales DETECTED (A) NOT DETECTED Final    Comment: Enterobacterales represent a large order of gram negative bacteria, not a single organism. CRITICAL RESULT CALLED TO, READ BACK BY AND VERIFIED WITH: ANGELA ROBBINS AT 8182 06/18/20.PMF    Enterobacter cloacae complex NOT DETECTED NOT DETECTED Final   Escherichia coli DETECTED (A) NOT DETECTED Final    Comment: CRITICAL RESULT CALLED TO, READ BACK BY AND VERIFIED WITH: ANGELA ROBBINS AT 9937 06/18/20.PMF    Klebsiella aerogenes NOT DETECTED NOT DETECTED Final   Klebsiella oxytoca NOT DETECTED NOT DETECTED Final   Klebsiella pneumoniae NOT DETECTED NOT DETECTED Final   Proteus species NOT DETECTED NOT DETECTED Final   Salmonella species NOT DETECTED NOT DETECTED Final   Serratia marcescens NOT DETECTED NOT DETECTED Final   Haemophilus influenzae NOT DETECTED NOT DETECTED Final   Neisseria meningitidis NOT DETECTED NOT DETECTED Final   Pseudomonas aeruginosa NOT DETECTED NOT DETECTED Final   Stenotrophomonas maltophilia NOT DETECTED NOT DETECTED Final   Candida albicans NOT DETECTED NOT DETECTED Final   Candida auris NOT DETECTED NOT DETECTED Final  Candida glabrata NOT DETECTED NOT DETECTED Final   Candida krusei NOT DETECTED NOT DETECTED Final   Candida parapsilosis NOT DETECTED NOT DETECTED Final   Candida tropicalis NOT DETECTED NOT DETECTED Final   Cryptococcus neoformans/gattii NOT DETECTED NOT DETECTED Final   CTX-M ESBL NOT DETECTED NOT DETECTED Final   Carbapenem resistance IMP NOT DETECTED NOT DETECTED Final   Carbapenem resistance KPC NOT DETECTED NOT  DETECTED Final   Carbapenem resistance NDM NOT DETECTED NOT DETECTED Final   Carbapenem resist OXA 48 LIKE NOT DETECTED NOT DETECTED Final   Carbapenem resistance VIM NOT DETECTED NOT DETECTED Final    Comment: Performed at [facility], [address] [place], [place] [address]  Resp Panel by RT-PCR (Flu A&B, Covid) Nasopharyngeal Swab     Status: None   Collection Time: 06/17/20  2:54 PM   Specimen: Nasopharyngeal Swab; Nasopharyngeal(NP) swabs in vial transport medium  Result Value Ref Range Status   SARS Coronavirus 2 by RT PCR NEGATIVE NEGATIVE Final    Comment: (NOTE) SARS-CoV-2 target nucleic acids are NOT DETECTED.  The SARS-CoV-2 RNA is generally detectable in upper respiratory specimens during the acute phase of infection. The lowest concentration of SARS-CoV-2 viral copies this assay can detect is 138 copies/mL. A negative result does not preclude SARS-Cov-2 infection and should not be used as the sole basis for treatment or other patient management decisions. A negative result may occur with  improper specimen collection/handling, submission of specimen other than nasopharyngeal swab, presence of viral mutation(s) within the areas targeted by this assay, and inadequate number of viral copies(<138 copies/mL). A negative result must be combined with clinical observations, patient history, and epidemiological information. The expected result is Negative.  Fact Sheet for Patients:  EntrepreneurPulse.com.au  Fact Sheet for Healthcare Providers:  IncredibleEmployment.be  This test is no t yet approved or cleared by the [place] FDA and  has been authorized for detection and/or diagnosis of SARS-CoV-2 by FDA under an Emergency Use Authorization (EUA). This EUA will remain  in effect (meaning this test can be used) for the duration of the COVID-19 declaration under Section 564(b)(1) of the Act, 21 U.S.C.section  360bbb-3(b)(1), unless the authorization is terminated  or revoked sooner.       Influenza A by PCR NEGATIVE NEGATIVE Final   Influenza B by PCR NEGATIVE NEGATIVE Final    Comment: (NOTE) The Xpert Xpress SARS-CoV-2/FLU/RSV plus assay is intended as an aid in the diagnosis of influenza from Nasopharyngeal swab specimens and should not be used as a sole basis for treatment. Nasal washings and aspirates are unacceptable for Xpert Xpress SARS-CoV-2/FLU/RSV testing.  Fact Sheet for Patients: EntrepreneurPulse.com.au  Fact Sheet for Healthcare Providers: IncredibleEmployment.be  This test is not yet approved or cleared by the [place] FDA and has been authorized for detection and/or diagnosis of SARS-CoV-2 by FDA under an Emergency Use Authorization (EUA). This EUA will remain in effect (meaning this test can be used) for the duration of the COVID-19 declaration under Section 564(b)(1) of the Act, 21 U.S.C. section 360bbb-3(b)(1), unless the authorization is terminated or revoked.  Performed at [facility], [address] [place], [place] [address]   Blood culture (single)     Status: None (Preliminary result)   Collection Time: 06/18/20  9:03 AM   Specimen: BLOOD LEFT WRIST  Result Value Ref Range Status   Specimen Description BLOOD LEFT WRIST  Final   Special Requests   Final    BOTTLES DRAWN AEROBIC  AND ANAEROBIC Blood Culture adequate volume   Culture   Final    NO GROWTH 4 DAYS Performed at [facility], [address] [place], [place] [address]    Report Status PENDING  Incomplete  Resp Panel by RT-PCR (Flu A&B, Covid) Nasopharyngeal Swab     Status: None   Collection Time: 06/18/20  9:55 AM   Specimen: Nasopharyngeal Swab; Nasopharyngeal(NP) swabs in vial transport medium  Result Value Ref Range Status   SARS Coronavirus 2 by RT PCR NEGATIVE NEGATIVE Final    Comment: (NOTE) SARS-CoV-2 target nucleic  acids are NOT DETECTED.  The SARS-CoV-2 RNA is generally detectable in upper respiratory specimens during the acute phase of infection. The lowest concentration of SARS-CoV-2 viral copies this assay can detect is 138 copies/mL. A negative result does not preclude SARS-Cov-2 infection and should not be used as the sole basis for treatment or other patient management decisions. A negative result may occur with  improper specimen collection/handling, submission of specimen other than nasopharyngeal swab, presence of viral mutation(s) within the areas targeted by this assay, and inadequate number of viral copies(<138 copies/mL). A negative result must be combined with clinical observations, patient history, and epidemiological information. The expected result is Negative.  Fact Sheet for Patients:  EntrepreneurPulse.com.au  Fact Sheet for Healthcare Providers:  IncredibleEmployment.be  This test is no t yet approved or cleared by the [place] FDA and  has been authorized for detection and/or diagnosis of SARS-CoV-2 by FDA under an Emergency Use Authorization (EUA). This EUA will remain  in effect (meaning this test can be used) for the duration of the COVID-19 declaration under Section 564(b)(1) of the Act, 21 U.S.C.section 360bbb-3(b)(1), unless the authorization is terminated  or revoked sooner.       Influenza A by PCR NEGATIVE NEGATIVE Final   Influenza B by PCR NEGATIVE NEGATIVE Final    Comment: (NOTE) The Xpert Xpress SARS-CoV-2/FLU/RSV plus assay is intended as an aid in the diagnosis of influenza from Nasopharyngeal swab specimens and should not be used as a sole basis for treatment. Nasal washings and aspirates are unacceptable for Xpert Xpress SARS-CoV-2/FLU/RSV testing.  Fact Sheet for Patients: EntrepreneurPulse.com.au  Fact Sheet for Healthcare Providers: IncredibleEmployment.be  This  test is not yet approved or cleared by the [place] FDA and has been authorized for detection and/or diagnosis of SARS-CoV-2 by FDA under an Emergency Use Authorization (EUA). This EUA will remain in effect (meaning this test can be used) for the duration of the COVID-19 declaration under Section 564(b)(1) of the Act, 21 U.S.C. section 360bbb-3(b)(1), unless the authorization is terminated or revoked.  Performed at [facility], [address] [place], [place] [address]      Labs: BNP (last 3 results) Recent Labs    06/18/20 0914  BNP 829.5*   Basic Metabolic Panel: Recent Labs  Lab 06/18/20 0903 06/19/20 0606 06/20/20 0526 06/21/20 0822 06/22/20 0554  NA 138 139 139 142 143  K 3.9 3.7 3.6 3.6 3.4*  CL 107 106 109 108 109  CO2 16* 22 23 24 25   GLUCOSE 96 78 98 121* 85  BUN 34* 37* 40* 34* 26*  CREATININE 2.64* 1.82* 1.58* 1.40* 1.25*  CALCIUM 8.2* 7.8* 8.2* 8.5* 8.3*  MG  --  1.8 2.4  --   --   PHOS  --  4.0 2.9  --   --    Liver Function Tests: Recent Labs  Lab 06/17/20 1338 06/18/20 0903 06/20/20 0526  AST 23 40  --   ALT 18 20  --   ALKPHOS 129* 79  --   BILITOT 1.1 0.8  --   PROT 8.2* 6.5  --   ALBUMIN 4.0 2.9* 2.6*   Recent Labs  Lab 06/17/20 1338  LIPASE 25   No results for input(s): AMMONIA in the last 168 hours. CBC: Recent Labs  Lab 06/17/20 1338 06/18/20 0903 06/19/20 0606 06/20/20 0526 06/21/20 0822 06/22/20 0554  WBC 5.1 31.2* 35.6* 37.7* 23.5* 14.7*  NEUTROABS 4.6 26.4*  --  25.4* 18.7* 7.4  HGB 15.1* 14.1 13.1 12.7 12.9 12.3  HCT 46.3* 42.6 38.9 37.7 38.9 37.6  MCV 96.7 96.4 97.0 95.0 95.8 96.4  PLT 233 126* 100* 105* 118* 123*   Cardiac Enzymes: No results for input(s): CKTOTAL, CKMB, CKMBINDEX, TROPONINI in the last 168 hours. BNP: Invalid input(s): POCBNP CBG: Recent Labs  Lab 06/18/20 1322  GLUCAP 97   D-Dimer No results for input(s): DDIMER in the last 72 hours. Hgb A1c No results for input(s):  HGBA1C in the last 72 hours. Lipid Profile No results for input(s): CHOL, HDL, LDLCALC, TRIG, CHOLHDL, LDLDIRECT in the last 72 hours. Thyroid function studies No results for input(s): TSH, T4TOTAL, T3FREE, THYROIDAB in the last 72 hours.  Invalid input(s): FREET3 Anemia work up No results for input(s): VITAMINB12, FOLATE, FERRITIN, TIBC, IRON, RETICCTPCT in the last 72 hours. Urinalysis    Component Value Date/Time   COLORURINE YELLOW (A) 06/17/2020 1338   APPEARANCEUR CLOUDY (A) 06/17/2020 1338   LABSPEC 1.010 06/17/2020 1338   PHURINE 5.0 06/17/2020 1338   GLUCOSEU NEGATIVE 06/17/2020 1338   HGBUR MODERATE (A) 06/17/2020 1338   BILIRUBINUR NEGATIVE 06/17/2020 1338   KETONESUR NEGATIVE 06/17/2020 1338   PROTEINUR NEGATIVE 06/17/2020 1338   NITRITE NEGATIVE 06/17/2020 1338   LEUKOCYTESUR MODERATE (A) 06/17/2020 1338   Sepsis Labs Invalid input(s): PROCALCITONIN,  WBC,  LACTICIDVEN Microbiology Recent Results (from the past 240 hour(s))  Culture, blood (Routine x 2)     Status: Abnormal   Collection Time: 06/17/20  1:38 PM   Specimen: BLOOD  Result Value Ref Range Status   Specimen Description   Final    BLOOD RIGHT ANTECUBITAL Performed at [facility], [address] [place], [place] [address]    Special Requests   Final    BOTTLES DRAWN AEROBIC AND ANAEROBIC Blood Culture adequate volume Performed at [facility], [address] [place], [place] [address]    Culture  Setup Time   Final    GRAM NEGATIVE RODS IN BOTH AEROBIC AND ANAEROBIC BOTTLES CRITICAL RESULT CALLED TO, READ BACK BY AND VERIFIED WITH: ANGELA ROBBINS AT 0981 06/18/20.PMF Performed at [facility], [address] [place], [place] [address]    Culture (A)  Final    ESCHERICHIA COLI SUSCEPTIBILITIES PERFORMED ON PREVIOUS CULTURE WITHIN THE LAST 5 DAYS. Performed at [facility], [place] [address], [place], [place] [address]    Report Status 06/20/2020 FINAL  Final   Urine Culture     Status: Abnormal   Collection Time: 06/17/20  1:38 PM   Specimen: Urine, Random  Result Value Ref Range Status   Specimen Description   Final    URINE, RANDOM Performed at [facility], [address] [place], [place] [address]    Special Requests   Final    NONE Performed at [facility], [address], [place], [place] [address]    Culture >=100,000 COLONIES/mL  ESCHERICHIA COLI (A)  Final   Report Status 06/20/2020 FINAL  Final   Organism ID, Bacteria ESCHERICHIA COLI (A)  Final      Susceptibility   Escherichia coli - MIC*    AMPICILLIN <=2 SENSITIVE Sensitive     CEFAZOLIN <=4 SENSITIVE Sensitive     CEFEPIME <=0.12 SENSITIVE Sensitive     CEFTRIAXONE <=0.25 SENSITIVE Sensitive     CIPROFLOXACIN <=0.25 SENSITIVE Sensitive     GENTAMICIN <=1 SENSITIVE Sensitive     IMIPENEM <=0.25 SENSITIVE Sensitive     NITROFURANTOIN <=16 SENSITIVE Sensitive     TRIMETH/SULFA <=20 SENSITIVE Sensitive     AMPICILLIN/SULBACTAM <=2 SENSITIVE Sensitive     PIP/TAZO <=4 SENSITIVE Sensitive     * >=100,000 COLONIES/mL ESCHERICHIA COLI  Culture, blood (Routine x 2)     Status: Abnormal   Collection Time: 06/17/20  1:42 PM   Specimen: BLOOD  Result Value Ref Range Status   Specimen Description   Final    BLOOD BLOOD LEFT WRIST Performed at [facility], [address] [place], [place] [address]    Special Requests   Final    BOTTLES DRAWN AEROBIC AND ANAEROBIC Blood Culture results may not be optimal due to an inadequate volume of blood received in culture bottles Performed at [facility], [address] [place], [place] [address]    Culture  Setup Time   Final    GRAM NEGATIVE RODS IN BOTH AEROBIC AND ANAEROBIC BOTTLES CRITICAL RESULT CALLED TO, READ BACK BY AND VERIFIED WITH: ANGELA ROBBINS AT 0640 06/18/20.PMF Performed at [facility], [place] [address], [place], [place] [address]    Culture ESCHERICHIA COLI (A)  Final    Report Status 06/20/2020 FINAL  Final   Organism ID, Bacteria ESCHERICHIA COLI  Final      Susceptibility   Escherichia coli - MIC*    AMPICILLIN <=2 SENSITIVE Sensitive     CEFAZOLIN <=4 SENSITIVE Sensitive     CEFEPIME <=0.12 SENSITIVE Sensitive     CEFTAZIDIME <=1 SENSITIVE Sensitive     CEFTRIAXONE <=0.25 SENSITIVE Sensitive     CIPROFLOXACIN <=0.25 SENSITIVE Sensitive     GENTAMICIN <=1 SENSITIVE Sensitive     IMIPENEM <=0.25 SENSITIVE Sensitive     TRIMETH/SULFA <=20 SENSITIVE Sensitive     AMPICILLIN/SULBACTAM <=2 SENSITIVE Sensitive     PIP/TAZO <=4 SENSITIVE Sensitive     * ESCHERICHIA COLI  Blood Culture ID Panel (Reflexed)     Status: Abnormal   Collection Time: 06/17/20  1:42 PM  Result Value Ref Range Status   Enterococcus faecalis NOT DETECTED NOT DETECTED Final   Enterococcus Faecium NOT DETECTED NOT DETECTED Final   Listeria monocytogenes NOT DETECTED NOT DETECTED Final   Staphylococcus species NOT DETECTED NOT DETECTED Final   Staphylococcus aureus (BCID) NOT DETECTED NOT DETECTED Final   Staphylococcus epidermidis NOT DETECTED NOT DETECTED Final   Staphylococcus lugdunensis NOT DETECTED NOT DETECTED Final   Streptococcus species NOT DETECTED NOT DETECTED Final   Streptococcus agalactiae NOT DETECTED NOT DETECTED Final   Streptococcus pneumoniae NOT DETECTED NOT DETECTED Final   Streptococcus pyogenes NOT DETECTED NOT DETECTED Final   A.calcoaceticus-baumannii NOT DETECTED NOT DETECTED Final   Bacteroides fragilis NOT DETECTED NOT DETECTED Final   Enterobacterales DETECTED (A) NOT DETECTED Final    Comment: Enterobacterales represent a large order of gram negative bacteria, not a single organism. CRITICAL RESULT CALLED TO, READ BACK BY AND VERIFIED WITH: ANGELA ROBBINS AT 0938 06/18/20.PMF  Enterobacter cloacae complex NOT DETECTED NOT DETECTED Final   Escherichia coli DETECTED (A) NOT DETECTED Final    Comment: CRITICAL RESULT CALLED TO, READ BACK BY AND  VERIFIED WITH: ANGELA ROBBINS AT 0640 06/18/20.PMF    Klebsiella aerogenes NOT DETECTED NOT DETECTED Final   Klebsiella oxytoca NOT DETECTED NOT DETECTED Final   Klebsiella pneumoniae NOT DETECTED NOT DETECTED Final   Proteus species NOT DETECTED NOT DETECTED Final   Salmonella species NOT DETECTED NOT DETECTED Final   Serratia marcescens NOT DETECTED NOT DETECTED Final   Haemophilus influenzae NOT DETECTED NOT DETECTED Final   Neisseria meningitidis NOT DETECTED NOT DETECTED Final   Pseudomonas aeruginosa NOT DETECTED NOT DETECTED Final   Stenotrophomonas maltophilia NOT DETECTED NOT DETECTED Final   Candida albicans NOT DETECTED NOT DETECTED Final   Candida auris NOT DETECTED NOT DETECTED Final   Candida glabrata NOT DETECTED NOT DETECTED Final   Candida krusei NOT DETECTED NOT DETECTED Final   Candida parapsilosis NOT DETECTED NOT DETECTED Final   Candida tropicalis NOT DETECTED NOT DETECTED Final   Cryptococcus neoformans/gattii NOT DETECTED NOT DETECTED Final   CTX-M ESBL NOT DETECTED NOT DETECTED Final   Carbapenem resistance IMP NOT DETECTED NOT DETECTED Final   Carbapenem resistance KPC NOT DETECTED NOT DETECTED Final   Carbapenem resistance NDM NOT DETECTED NOT DETECTED Final   Carbapenem resist OXA 48 LIKE NOT DETECTED NOT DETECTED Final   Carbapenem resistance VIM NOT DETECTED NOT DETECTED Final    Comment: Performed at [facility], [address] [place], [place] [address]  Resp Panel by RT-PCR (Flu A&B, Covid) Nasopharyngeal Swab     Status: None   Collection Time: 06/17/20  2:54 PM   Specimen: Nasopharyngeal Swab; Nasopharyngeal(NP) swabs in vial transport medium  Result Value Ref Range Status   SARS Coronavirus 2 by RT PCR NEGATIVE NEGATIVE Final    Comment: (NOTE) SARS-CoV-2 target nucleic acids are NOT DETECTED.  The SARS-CoV-2 RNA is generally detectable in upper respiratory specimens during the acute phase of infection. The lowest concentration of  SARS-CoV-2 viral copies this assay can detect is 138 copies/mL. A negative result does not preclude SARS-Cov-2 infection and should not be used as the sole basis for treatment or other patient management decisions. A negative result may occur with  improper specimen collection/handling, submission of specimen other than nasopharyngeal swab, presence of viral mutation(s) within the areas targeted by this assay, and inadequate number of viral copies(<138 copies/mL). A negative result must be combined with clinical observations, patient history, and epidemiological information. The expected result is Negative.  Fact Sheet for Patients:  EntrepreneurPulse.com.au  Fact Sheet for Healthcare Providers:  IncredibleEmployment.be  This test is no t yet approved or cleared by the [place] FDA and  has been authorized for detection and/or diagnosis of SARS-CoV-2 by FDA under an Emergency Use Authorization (EUA). This EUA will remain  in effect (meaning this test can be used) for the duration of the COVID-19 declaration under Section 564(b)(1) of the Act, 21 U.S.C.section 360bbb-3(b)(1), unless the authorization is terminated  or revoked sooner.       Influenza A by PCR NEGATIVE NEGATIVE Final   Influenza B by PCR NEGATIVE NEGATIVE Final    Comment: (NOTE) The Xpert Xpress SARS-CoV-2/FLU/RSV plus assay is intended as an aid in the diagnosis of influenza from Nasopharyngeal swab specimens and should not be used as a sole basis for treatment. Nasal washings and aspirates are unacceptable for Xpert Xpress SARS-CoV-2/FLU/RSV testing.  Fact  Sheet for Patients: EntrepreneurPulse.com.au  Fact Sheet for Healthcare Providers: IncredibleEmployment.be  This test is not yet approved or cleared by the [place] FDA and has been authorized for detection and/or diagnosis of SARS-CoV-2 by FDA under an Emergency Use  Authorization (EUA). This EUA will remain in effect (meaning this test can be used) for the duration of the COVID-19 declaration under Section 564(b)(1) of the Act, 21 U.S.C. section 360bbb-3(b)(1), unless the authorization is terminated or revoked.  Performed at [facility], [address] [place], [place] [address]   Blood culture (single)     Status: None (Preliminary result)   Collection Time: 06/18/20  9:03 AM   Specimen: BLOOD LEFT WRIST  Result Value Ref Range Status   Specimen Description BLOOD LEFT WRIST  Final   Special Requests   Final    BOTTLES DRAWN AEROBIC AND ANAEROBIC Blood Culture adequate volume   Culture   Final    NO GROWTH 4 DAYS Performed at [facility], [address], [place], [place] [address]    Report Status PENDING  Incomplete  Resp Panel by RT-PCR (Flu A&B, Covid) Nasopharyngeal Swab     Status: None   Collection Time: 06/18/20  9:55 AM   Specimen: Nasopharyngeal Swab; Nasopharyngeal(NP) swabs in vial transport medium  Result Value Ref Range Status   SARS Coronavirus 2 by RT PCR NEGATIVE NEGATIVE Final    Comment: (NOTE) SARS-CoV-2 target nucleic acids are NOT DETECTED.  The SARS-CoV-2 RNA is generally detectable in upper respiratory specimens during the acute phase of infection. The lowest concentration of SARS-CoV-2 viral copies this assay can detect is 138 copies/mL. A negative result does not preclude SARS-Cov-2 infection and should not be used as the sole basis for treatment or other patient management decisions. A negative result may occur with  improper specimen collection/handling, submission of specimen other than nasopharyngeal swab, presence of viral mutation(s) within the areas targeted by this assay, and inadequate number of viral copies(<138 copies/mL). A negative result must be combined with clinical observations, patient history, and epidemiological information. The expected result is Negative.  Fact Sheet  for Patients:  EntrepreneurPulse.com.au  Fact Sheet for Healthcare Providers:  IncredibleEmployment.be  This test is no t yet approved or cleared by the [place] FDA and  has been authorized for detection and/or diagnosis of SARS-CoV-2 by FDA under an Emergency Use Authorization (EUA). This EUA will remain  in effect (meaning this test can be used) for the duration of the COVID-19 declaration under Section 564(b)(1) of the Act, 21 U.S.C.section 360bbb-3(b)(1), unless the authorization is terminated  or revoked sooner.       Influenza A by PCR NEGATIVE NEGATIVE Final   Influenza B by PCR NEGATIVE NEGATIVE Final    Comment: (NOTE) The Xpert Xpress SARS-CoV-2/FLU/RSV plus assay is intended as an aid in the diagnosis of influenza from Nasopharyngeal swab specimens and should not be used as a sole basis for treatment. Nasal washings and aspirates are unacceptable for Xpert Xpress SARS-CoV-2/FLU/RSV testing.  Fact Sheet for Patients: EntrepreneurPulse.com.au  Fact Sheet for Healthcare Providers: IncredibleEmployment.be  This test is not yet approved or cleared by the [place] FDA and has been authorized for detection and/or diagnosis of SARS-CoV-2 by FDA under an Emergency Use Authorization (EUA). This EUA will remain in effect (meaning this test can be used) for the duration of the COVID-19 declaration under Section 564(b)(1) of the Act, 21 U.S.C. section 360bbb-3(b)(1), unless the authorization is terminated or revoked.  Performed  at [facility], [address] [place], [place] [address]      Time coordinating discharge:  I have spent 35 minutes face to face with the patient and on the ward discussing the patients care, assessment, plan and disposition with other care givers. >50% of the time was devoted counseling the patient about the risks and benefits of treatment/Discharge  disposition and coordinating care.   SIGNED:   Damita Lack, MD  [facility] 06/22/2020, 2:37 PM   If 7PM-7AM, please contact night-coverage

## 2020-06-23 LAB — CULTURE, BLOOD (SINGLE)
Culture: NO GROWTH
Special Requests: ADEQUATE

## 2020-07-06 ENCOUNTER — Ambulatory Visit: Payer: Medicare Other | Admitting: Urology

## 2020-07-06 ENCOUNTER — Other Ambulatory Visit: Payer: Self-pay

## 2020-07-06 ENCOUNTER — Encounter: Payer: Self-pay | Admitting: Urology

## 2020-07-06 VITALS — BP 127/81 | HR 81 | Ht 65.0 in | Wt 263.0 lb

## 2020-07-06 DIAGNOSIS — N10 Acute tubulo-interstitial nephritis: Secondary | ICD-10-CM | POA: Diagnosis not present

## 2020-07-06 DIAGNOSIS — N201 Calculus of ureter: Secondary | ICD-10-CM

## 2020-07-06 NOTE — Patient Instructions (Signed)
Laser Therapy for Kidney Stones Laser therapy for kidney stones is a procedure to break up small, hard mineral deposits that form in the kidney (kidney stones). The procedure is done using a device that produces a focused beam of light (laser). The laser breaks up kidney stones into pieces that are small enough to be passed out of the body through urination or removed from the body during the procedure. You may need laser therapy if you have kidney stones that are painful or block your urinary tract. This procedure is done by inserting a tube (ureteroscope) into your kidney through the urethral opening. The urethra is the part of the body that drains urine from the bladder. In women, the urethra opens above the vaginal opening. In men, the urethra opens at the tip of the penis. The ureteroscope is inserted through the urethra, and surgical instruments are moved through the bladder and the muscular tube that connects the kidney to the bladder (ureter) until they reach the kidney. Tell a health care provider about:  Any allergies you have.  All medicines you are taking, including vitamins, herbs, eye drops, creams, and over-the-counter medicines.  Any problems you or family members have had with anesthetic medicines.  Any blood disorders you have.  Any surgeries you have had.  Any medical conditions you have.  Whether you are pregnant or may be pregnant. What are the risks? Generally, this is a safe procedure. However, problems may occur, including:  Infection.  Bleeding.  Allergic reactions to medicines.  Damage to the urethra, bladder, or ureter.  Urinary tract infection (UTI).  Narrowing of the urethra (urethral stricture).  Difficulty passing urine.  Blockage of the kidney caused by a fragment of kidney stone. What happens before the procedure? Medicines  Ask your health care provider about: ? Changing or stopping your regular medicines. This is especially important if you  are taking diabetes medicines or blood thinners. ? Taking medicines such as aspirin and ibuprofen. These medicines can thin your blood. Do not take these medicines unless your health care provider tells you to take them. ? Taking over-the-counter medicines, vitamins, herbs, and supplements. Eating and drinking Follow instructions from your health care provider about eating and drinking, which may include:  8 hours before the procedure - stop eating heavy meals or foods, such as meat, fried foods, or fatty foods.  6 hours before the procedure - stop eating light meals or foods, such as toast or cereal.  6 hours before the procedure - stop drinking milk or drinks that contain milk.  2 hours before the procedure - stop drinking clear liquids. Staying hydrated Follow instructions from your health care provider about hydration, which may include:  Up to 2 hours before the procedure - you may continue to drink clear liquids, such as water, clear fruit juice, black coffee, and plain tea.   General instructions  You may have a physical exam before the procedure. You may also have tests, such as imaging tests and blood or urine tests.  If your ureter is too narrow, your health care provider may place a soft, flexible tube (stent) inside of it. The stent may be placed days or weeks before your laser therapy procedure.  Plan to have someone take you home from the hospital or clinic.  If you will be going home right after the procedure, plan to have someone stay with you for 24 hours.  Do not use any products that contain nicotine or tobacco for at least  4 weeks before the procedure. These products include cigarettes, e-cigarettes, and chewing tobacco. If you need help quitting, ask your health care provider.  Ask your health care provider: ? How your surgical site will be marked or identified. ? What steps will be taken to help prevent infection. These may include:  Removing hair at the surgery  site.  Washing skin with a germ-killing soap.  Taking antibiotic medicine. What happens during the procedure?  An IV will be inserted into one of your veins.  You will be given one or more of the following: ? A medicine to help you relax (sedative). ? A medicine to numb the area (local anesthetic). ? A medicine to make you fall asleep (general anesthetic).  A ureteroscope will be inserted into your urethra. The ureteroscope will send images to a video screen in the operating room to guide your surgeon to the area of your kidney that will be treated.  A small, flexible tube will be threaded through the ureteroscope and into your bladder and ureter, up to your kidney.  The laser device will be inserted into your kidney through the tube. Your surgeon will pulse the laser on and off to break up kidney stones.  A surgical instrument that has a tiny wire basket may be inserted through the tube into your kidney to remove the pieces of broken kidney stone. The procedure may vary among health care providers and hospitals.   What happens after the procedure?  Your blood pressure, heart rate, breathing rate, and blood oxygen level will be monitored until you leave the hospital or clinic.  You will be given pain medicine as needed.  You may continue to receive antibiotics.  You may have a stent temporarily placed in your ureter.  Do not drive for 24 hours if you were given a sedative during your procedure.  You may be given a strainer to collect any stone fragments that you pass in your urine. Your health care provider may have these tested. Summary  Laser therapy for kidney stones is a procedure to break up kidney stones into pieces that are small enough to be passed out of the body through urination or removed during the procedure.  Follow instructions from your health care provider about eating and drinking before the procedure.  During the procedure, the ureteroscope will send images  to a video screen to guide your surgeon to the area of your kidney that will be treated.  Do not drive for 24 hours if you were given a sedative during your procedure. This information is not intended to replace advice given to you by your health care provider. Make sure you discuss any questions you have with your health care provider. Document Revised: 01/02/2018 Document Reviewed: 01/02/2018   Ureteral Stent Implantation  Ureteral stent implantation is a procedure to insert (implant) a flexible, soft, plastic tube (stent) into a ureter. Ureters are the tube-like parts of the body that drain urine from the kidneys. The stent supports the ureter while it heals and helps to drain urine. You may have a ureteral stent implanted after having a procedure to remove a blockage from the ureter (ureterolysis or pyeloplasty). You may also have a stent implanted to open the flow of urine when you have a blockage caused by a kidney stone, tumor, blood clot, or infection. You have two ureters, one on each side of the body. The ureters connect the kidneys to the organ that holds urine until it passes out of  the body (bladder). The stent is placed so that one end is in the kidney, and one end is in the bladder. The stent is usually taken out after your ureter has healed. Depending on your condition, you may have a stent for just a few weeks, or you may have a long-term stent that will need to be replaced every few months. Tell a health care provider about:  Any allergies you have.  All medicines you are taking, including vitamins, herbs, eye drops, creams, and over-the-counter medicines.  Any problems you or family members have had with anesthetic medicines.  Any blood disorders you have.  Any surgeries you have had.  Any medical conditions you have.  Whether you are pregnant or may be pregnant. What are the risks? Generally, this is a safe procedure. However, problems may occur,  including:  Infection.  Bleeding.  Allergic reactions to medicines.  Damage to other structures or organs. Tearing (perforation) of the ureter is possible.  Movement of the stent away from where it is placed during surgery (migration). What happens before the procedure? Medicines Ask your health care provider about:  Changing or stopping your regular medicines. This is especially important if you are taking diabetes medicines or blood thinners.  Taking medicines such as aspirin and ibuprofen. These medicines can thin your blood. Do not take these medicines unless your health care provider tells you to take them.  Taking over-the-counter medicines, vitamins, herbs, and supplements. Eating and drinking Follow instructions from your health care provider about eating and drinking, which may include:  8 hours before the procedure - stop eating heavy meals or foods, such as meat, fried foods, or fatty foods.  6 hours before the procedure - stop eating light meals or foods, such as toast or cereal.  6 hours before the procedure - stop drinking milk or drinks that contain milk.  2 hours before the procedure - stop drinking clear liquids. Staying hydrated Follow instructions from your health care provider about hydration, which may include:  Up to 2 hours before the procedure - you may continue to drink clear liquids, such as water, clear fruit juice, black coffee, and plain tea. General instructions  Do not drink alcohol.  Do not use any products that contain nicotine or tobacco for at least 4 weeks before the procedure. These products include cigarettes, e-cigarettes, and chewing tobacco. If you need help quitting, ask your health care provider.  You may have an exam or testing, such as imaging or blood tests.  Ask your health care provider what steps will be taken to help prevent infection. These may include: ? Removing hair at the surgery site. ? Washing skin with a  germ-killing soap. ? Taking antibiotic medicine.  Plan to have someone take you home from the hospital or clinic.  If you will be going home right after the procedure, plan to have someone with you for 24 hours. What happens during the procedure?  An IV will be inserted into one of your veins.  You may be given a medicine to help you relax (sedative).  You may be given a medicine to make you fall asleep (general anesthetic).  A thin, tube-shaped instrument with a light and tiny camera at the end (cystoscope) will be inserted into your urethra. The urethra is the tube that drains urine from the bladder out of the body. In men, the urethra opens at the end of the penis. In women, the urethra opens in front of the vaginal  opening.  The cystoscope will be passed into your bladder.  A thin wire (guide wire) will be passed through your bladder and into your ureter. This is used to guide the stent into your ureter.  The stent will be inserted into your ureter.  The guide wire and the cystoscope will be removed.  A flexible tube (catheter) may be inserted through your urethra so that one end is in your bladder. This helps to drain urine from your bladder. The procedure may vary among hospitals and health care providers. What happens after the procedure?  Your blood pressure, heart rate, breathing rate, and blood oxygen level will be monitored until you leave the hospital or clinic.  You may continue to receive medicine and fluids through an IV.  You may have some soreness or pain in your abdomen and urethra. Medicines will be available to help you.  You will be encouraged to get up and walk around as soon as you can.  You may have a catheter draining your urine.  You will have some blood in your urine.  Do not drive for 24 hours if you were given a sedative during your procedure. Summary  Ureteral stent implantation is a procedure to insert a flexible, soft, plastic tube (stent)  into a ureter.  You may have a stent implanted to support the ureter while it heals after a procedure or to open the flow of urine if there is a blockage.  Follow instructions from your health care provider about taking medicines and about eating and drinking before the procedure.  Depending on your condition, you may have a stent for just a few weeks, or you may have a long-term stent that will need to be replaced every few months. This information is not intended to replace advice given to you by your health care provider. Make sure you discuss any questions you have with your health care provider. Document Revised: 01/28/2018 Document Reviewed: 01/29/2018 Elsevier Patient Education  2021 [employer] (5th ed., pp. [id number]). [place], [place]: Elsevier.">  Dietary Guidelines to Help Prevent Kidney Stones Kidney stones are deposits of minerals and salts that form inside your kidneys. Your risk of developing kidney stones may be greater depending on your diet, your lifestyle, the medicines you take, and whether you have certain medical conditions. Most people can lower their chances of developing kidney stones by following the instructions below. Your dietitian may give you more specific instructions depending on your overall health and the type of kidney stones you tend to develop. What are tips for following this plan? Reading food labels  Choose foods with "no salt added" or "low-salt" labels. Limit your salt (sodium) intake to less than 1,500 mg a day.  Choose foods with calcium for each meal and snack. Try to eat about 300 mg of calcium at each meal. Foods that contain 200-500 mg of calcium a serving include: ? 8 oz (237 mL) of milk, calcium-fortifiednon-dairy milk, and calcium-fortifiedfruit juice. Calcium-fortified means that calcium has been added to these drinks. ? 8 oz (237 mL) of kefir, yogurt, and soy yogurt. ? 4 oz (114 g) of tofu. ? 1 oz (28 g)  of cheese. ? 1 cup (150 g) of dried figs. ? 1 cup (91 g) of cooked broccoli. ? One 3 oz (85 g) can of sardines or mackerel. Most people need 1,000-1,500 mg of calcium a day. Talk to your dietitian about how much calcium is recommended for you.  Shopping  Buy plenty of fresh fruits and vegetables. Most people do not need to avoid fruits and vegetables, even if these foods contain nutrients that may contribute to kidney stones.  When shopping for convenience foods, choose: ? Whole pieces of fruit. ? Pre-made salads with dressing on the side. ? Low-fat fruit and yogurt smoothies.  Avoid buying frozen meals or prepared deli foods. These can be high in sodium.  Look for foods with live cultures, such as yogurt and kefir.  Choose high-fiber grains, such as whole-wheat breads, oat bran, and wheat cereals. Cooking  Do not add salt to food when cooking. Place a salt shaker on the table and allow each person to add his or her own salt to taste.  Use vegetable protein, such as beans, textured vegetable protein (TVP), or tofu, instead of meat in pasta, casseroles, and soups. Meal planning  Eat less salt, if told by your dietitian. To do this: ? Avoid eating processed or pre-made food. ? Avoid eating fast food.  Eat less animal protein, including cheese, meat, poultry, or fish, if told by your dietitian. To do this: ? Limit the number of times you have meat, poultry, fish, or cheese each week. Eat a diet free of meat at least 2 days a week. ? Eat only one serving each day of meat, poultry, fish, or seafood. ? When you prepare animal protein, cut pieces into small portion sizes. For most meat and fish, one serving is about the size of the palm of your hand.  Eat at least five servings of fresh fruits and vegetables each day. To do this: ? Keep fruits and vegetables on hand for snacks. ? Eat one piece of fruit or a handful of berries with breakfast. ? Have a salad and fruit at lunch. ? Have  two kinds of vegetables at dinner.  Limit foods that are high in a substance called oxalate. These include: ? Spinach (cooked), rhubarb, beets, sweet potatoes, and Swiss chard. ? Peanuts. ? Potato chips, french fries, and baked potatoes with skin on. ? Nuts and nut products. ? Chocolate.  If you regularly take a diuretic medicine, make sure to eat at least 1 or 2 servings of fruits or vegetables that are high in potassium each day. These include: ? Avocado. ? Banana. ? Orange, prune, carrot, or tomato juice. ? Baked potato. ? Cabbage. ? Beans and split peas. Lifestyle  Drink enough fluid to keep your urine pale yellow. This is the most important thing you can do. Spread your fluid intake throughout the day.  If you drink alcohol: ? Limit how much you use to:  0-1 drink a day for women who are not pregnant.  0-2 drinks a day for men. ? Be aware of how much alcohol is in your drink. In the [place] one drink equals one 12 oz bottle of beer (355 mL), one 5 oz glass of wine (148 mL), or one 1 oz glass of hard liquor (44 mL).  Lose weight if told by your health care provider. Work with your dietitian to find an eating plan and weight loss strategies that work best for you.   General information  Talk to your health care provider and dietitian about taking daily supplements. You may be told the following depending on your health and the cause of your kidney stones: ? Not to take supplements with vitamin C. ? To take a calcium supplement. ? To take a daily probiotic supplement. ? To take other supplements  such as magnesium, fish oil, or vitamin B6.  Take over-the-counter and prescription medicines only as told by your health care provider. These include supplements. What foods should I limit? Limit your intake of the following foods, or eat them as told by your dietitian. Vegetables Spinach. Rhubarb. Beets. Canned vegetables. Angie Fava. Olives. Baked potatoes with skin. Grains Wheat  bran. Baked goods. Salted crackers. Cereals high in sugar. Meats and other proteins Nuts. Nut butters. Large portions of meat, poultry, or fish. Salted, precooked, or cured meats, such as sausages, meat loaves, and hot dogs. Dairy Cheese. Beverages Regular soft drinks. Regular vegetable juice. Seasonings and condiments Seasoning blends with salt. Salad dressings. Soy sauce. Ketchup. Barbecue sauce. Other foods Canned soups. Canned pasta sauce. Casseroles. Pizza. Lasagna. Frozen meals. Potato chips. [place] fries. The items listed above may not be a complete list of foods and beverages you should limit. Contact a dietitian for more information. What foods should I avoid? Talk to your dietitian about specific foods you should avoid based on the type of kidney stones you have and your overall health. Fruits Grapefruit. The item listed above may not be a complete list of foods and beverages you should avoid. Contact a dietitian for more information. Summary  Kidney stones are deposits of minerals and salts that form inside your kidneys.  You can lower your risk of kidney stones by making changes to your diet.  The most important thing you can do is drink enough fluid. Drink enough fluid to keep your urine pale yellow.  Talk to your dietitian about how much calcium you should have each day, and eat less salt and animal protein as told by your dietitian. This information is not intended to replace advice given to you by your health care provider. Make sure you discuss any questions you have with your health care provider. Document Revised: 04/16/2019 Document Reviewed: 04/16/2019 Elsevier Patient Education  2021 [employer].

## 2020-07-06 NOTE — Progress Notes (Signed)
   07/06/2020 3:15 PM   Heather Simmons 1951/02/17 [id number]  Reason for visit: Nephrolithiasis, UTI  HPI: I saw Ms. Lynn and her sister in urology clinic to discuss her kidney stones and recent stent placement.  She is a 70 year old female with morbid obesity who was admitted on 06/18/2020 with sepsis from urinary source and a 4 mm right distal ureteral stone with a 1.5 cm right renal stone and underwent emergent right-sided ureteral stent placement with Dr. Jeffie Pollock.  Urine and blood cultures grew pansensitive E. coli.  She was treated with culture appropriate Cipro and cefdinir.  She is tolerating her statin well aside from some mild urgency and frequency.  We discussed the need for definitive management with right ureteroscopy, laser lithotripsy and stent placement. We specifically discussed the risks ureteroscopy including bleeding, infection/sepsis, stent related symptoms including flank pain/urgency/frequency/incontinence/dysuria, ureteral injury, inability to access stone, or need for staged or additional procedures.  Urine culture sent today, call with results Schedule right ureteroscopy, laser lithotripsy, stent placement next week  Billey Co, MD  [facility] [address] [place], [place] [address] [phone]

## 2020-07-07 ENCOUNTER — Other Ambulatory Visit: Payer: Self-pay | Admitting: Urology

## 2020-07-07 DIAGNOSIS — N201 Calculus of ureter: Secondary | ICD-10-CM

## 2020-07-10 LAB — CULTURE, URINE COMPREHENSIVE

## 2020-07-11 ENCOUNTER — Encounter
Admission: RE | Admit: 2020-07-11 | Discharge: 2020-07-11 | Disposition: A | Discharge status: Home / Self Care | Payer: Medicare Other | Source: Ambulatory Visit | Attending: Urology | Admitting: Urology

## 2020-07-11 ENCOUNTER — Other Ambulatory Visit: Payer: Self-pay

## 2020-07-11 HISTORY — DX: Personal history of urinary calculi: Z87.442

## 2020-07-11 HISTORY — DX: Essential (primary) hypertension: I10

## 2020-07-11 NOTE — Patient Instructions (Signed)
Your procedure is scheduled on: 07/15/20 Report to [facility]. To find out your arrival time please call [phone] between 1PM - 3PM on 07/14/20.  Remember: Instructions that are not followed completely may result in serious medical risk, up to and including death, or upon the discretion of your surgeon and anesthesiologist your surgery may need to be rescheduled.     _X__ 1. Do not eat food after midnight the night before your procedure.                 No gum chewing or hard candies. You may drink clear liquids up to 2 hours                 before you are scheduled to arrive for your surgery- DO not drink clear                 liquids within 2 hours of the start of your surgery.                 Clear Liquids include:  water, apple juice without pulp, clear carbohydrate                 drink such as Clearfast or Gatorade, Black Coffee or Tea (Do not add                 anything to coffee or tea). Diabetics water only  __X__2.  On the morning of surgery brush your teeth with toothpaste and water, you                 may rinse your mouth with mouthwash if you wish.  Do not swallow any              toothpaste of mouthwash.     _X__ 3.  No Alcohol for 24 hours before or after surgery.   _X__ 4.  Do Not Smoke or use e-cigarettes For 24 Hours Prior to Your Surgery.                 Do not use any chewable tobacco products for at least 6 hours prior to                 surgery.  ____  5.  Bring all medications with you on the day of surgery if instructed.   __X__  6.  Notify your doctor if there is any change in your medical condition      (cold, fever, infections).     Do not wear jewelry, make-up, hairpins, clips or nail polish. Do not wear lotions, powders, or perfumes.  Do not shave 48 hours prior to surgery. Men may shave face and neck. Do not bring valuables to the hospital.    [facility] is not responsible for any belongings or  valuables.  Contacts, dentures/partials or body piercings may not be worn into surgery. Bring a case for your contacts, glasses or hearing aids, a denture cup will be supplied. Leave your suitcase in the car. After surgery it may be brought to your room. For patients admitted to the hospital, discharge time is determined by your treatment team.   Patients discharged the day of surgery will not be allowed to drive home.   Please read over the following fact sheets that you were given:    __X__ Take these medicines the morning of surgery with A SIP OF WATER:    1.  carvedilol (COREG) 6.25 MG tablet  2.   3.   4.  5.  6.  ____ Fleet Enema (as directed)   ____ Use CHG Soap/SAGE wipes as directed  ____ Use inhalers on the day of surgery  ____ Stop metformin/Janumet/Farxiga 2 days prior to surgery    ____ Take 1/2 of usual insulin dose the night before surgery. No insulin the morning          of surgery.   ____ Stop Blood Thinners Coumadin/Plavix/Xarelto/Pleta/Pradaxa/Eliquis/Effient/Aspirin  on   Or contact your Surgeon, Cardiologist or Medical Doctor regarding  ability to stop your blood thinners  __X__ Stop Anti-inflammatories 7 days before surgery such as Advil, Ibuprofen, Motrin,  BC or Goodies Powder, Naprosyn, Naproxen, Aleve, Aspirin    __X__ Stop all herbal supplements, fish oil or vitamin E until after surgery.    ____ Bring C-Pap to the hospital.

## 2020-07-13 ENCOUNTER — Other Ambulatory Visit: Payer: Self-pay

## 2020-07-13 ENCOUNTER — Other Ambulatory Visit
Admission: RE | Admit: 2020-07-13 | Discharge: 2020-07-13 | Disposition: A | Discharge status: Home / Self Care | Payer: Medicare Other | Source: Ambulatory Visit | Attending: Urology | Admitting: Urology

## 2020-07-13 DIAGNOSIS — Z20822 Contact with and (suspected) exposure to covid-19: Secondary | ICD-10-CM | POA: Diagnosis not present

## 2020-07-13 DIAGNOSIS — Z01812 Encounter for preprocedural laboratory examination: Secondary | ICD-10-CM | POA: Diagnosis present

## 2020-07-13 LAB — SARS CORONAVIRUS 2 (TAT 6-24 HRS): SARS Coronavirus 2: NEGATIVE

## 2020-07-15 ENCOUNTER — Ambulatory Visit: Payer: Medicare Other | Admitting: Certified Registered Nurse Anesthetist

## 2020-07-15 ENCOUNTER — Encounter
Admission: RE | Disposition: A | Discharge status: Home Health | Payer: Self-pay | Source: Home / Self Care | Attending: Urology

## 2020-07-15 ENCOUNTER — Encounter: Payer: Self-pay | Admitting: Urology

## 2020-07-15 ENCOUNTER — Ambulatory Visit: Payer: Medicare Other

## 2020-07-15 ENCOUNTER — Ambulatory Visit
Admission: RE | Admit: 2020-07-15 | Discharge: 2020-07-15 | Disposition: A | Discharge status: Home Health | Payer: Medicare Other | Attending: Urology | Admitting: Urology

## 2020-07-15 DIAGNOSIS — Z803 Family history of malignant neoplasm of breast: Secondary | ICD-10-CM | POA: Insufficient documentation

## 2020-07-15 DIAGNOSIS — Z79899 Other long term (current) drug therapy: Secondary | ICD-10-CM | POA: Insufficient documentation

## 2020-07-15 DIAGNOSIS — N200 Calculus of kidney: Secondary | ICD-10-CM

## 2020-07-15 DIAGNOSIS — N202 Calculus of kidney with calculus of ureter: Secondary | ICD-10-CM | POA: Diagnosis present

## 2020-07-15 DIAGNOSIS — Z888 Allergy status to other drugs, medicaments and biological substances status: Secondary | ICD-10-CM | POA: Insufficient documentation

## 2020-07-15 DIAGNOSIS — N201 Calculus of ureter: Secondary | ICD-10-CM

## 2020-07-15 HISTORY — PX: CYSTOSCOPY/URETEROSCOPY/HOLMIUM LASER/STENT PLACEMENT: SHX6546

## 2020-07-15 LAB — POCT I-STAT, CHEM 8
BUN: 18 mg/dL (ref 8–23)
Calcium, Ion: 1.18 mmol/L (ref 1.15–1.40)
Chloride: 105 mmol/L (ref 98–111)
Creatinine, Ser: 1.3 mg/dL — ABNORMAL HIGH (ref 0.44–1.00)
Glucose, Bld: 116 mg/dL — ABNORMAL HIGH (ref 70–99)
HCT: 47 % — ABNORMAL HIGH (ref 36.0–46.0)
Hemoglobin: 16 g/dL — ABNORMAL HIGH (ref 12.0–15.0)
Potassium: 3.5 mmol/L (ref 3.5–5.1)
Sodium: 143 mmol/L (ref 135–145)
TCO2: 23 mmol/L (ref 22–32)

## 2020-07-15 SURGERY — CYSTOSCOPY/URETEROSCOPY/HOLMIUM LASER/STENT PLACEMENT
Anesthesia: General | Laterality: Right

## 2020-07-15 MED ORDER — FENTANYL CITRATE (PF) 100 MCG/2ML IJ SOLN
INTRAMUSCULAR | Status: DC | PRN
  Administered 2020-07-15: 50 ug via INTRAVENOUS

## 2020-07-15 MED ORDER — DEXAMETHASONE SODIUM PHOSPHATE 10 MG/ML IJ SOLN
INTRAMUSCULAR | Status: AC
  Filled 2020-07-15: qty 1

## 2020-07-15 MED ORDER — HYDROCODONE-ACETAMINOPHEN 5-325 MG PO TABS: 1.0000 | ORAL_TABLET | Freq: Four times a day (QID) | ORAL | 0 refills | Status: AC | PRN

## 2020-07-15 MED ORDER — LACTATED RINGERS IV SOLN: INTRAVENOUS | Status: DC

## 2020-07-15 MED ORDER — ACETAMINOPHEN 10 MG/ML IV SOLN
INTRAVENOUS | Status: AC
  Filled 2020-07-15: qty 100

## 2020-07-15 MED ORDER — ORAL CARE MOUTH RINSE: 15.0000 mL | Freq: Once | OROMUCOSAL | Status: AC

## 2020-07-15 MED ORDER — GLYCOPYRROLATE 0.2 MG/ML IJ SOLN
INTRAMUSCULAR | Status: DC | PRN
  Administered 2020-07-15: .2 mg via INTRAVENOUS

## 2020-07-15 MED ORDER — SUGAMMADEX SODIUM 500 MG/5ML IV SOLN
INTRAVENOUS | Status: AC
  Filled 2020-07-15: qty 5

## 2020-07-15 MED ORDER — ROCURONIUM BROMIDE 10 MG/ML (PF) SYRINGE
PREFILLED_SYRINGE | INTRAVENOUS | Status: AC
  Filled 2020-07-15: qty 20

## 2020-07-15 MED ORDER — CEFAZOLIN SODIUM-DEXTROSE 2-4 GM/100ML-% IV SOLN
2.0000 g | INTRAVENOUS | Status: AC
  Administered 2020-07-15: 2 g via INTRAVENOUS

## 2020-07-15 MED ORDER — LIDOCAINE HCL (CARDIAC) PF 100 MG/5ML IV SOSY
PREFILLED_SYRINGE | INTRAVENOUS | Status: DC | PRN
  Administered 2020-07-15: 100 mg via INTRAVENOUS

## 2020-07-15 MED ORDER — ONDANSETRON HCL 4 MG/2ML IJ SOLN
INTRAMUSCULAR | Status: DC | PRN
  Administered 2020-07-15: 4 mg via INTRAVENOUS

## 2020-07-15 MED ORDER — DEXAMETHASONE SODIUM PHOSPHATE 10 MG/ML IJ SOLN
INTRAMUSCULAR | Status: DC | PRN
  Administered 2020-07-15: 8 mg via INTRAVENOUS

## 2020-07-15 MED ORDER — IOHEXOL 180 MG/ML  SOLN
INTRAMUSCULAR | Status: DC | PRN
  Administered 2020-07-15: 20 mL

## 2020-07-15 MED ORDER — SULFAMETHOXAZOLE-TRIMETHOPRIM 800-160 MG PO TABS: 1.0000 | ORAL_TABLET | Freq: Every day | ORAL | 0 refills | Status: DC

## 2020-07-15 MED ORDER — ONDANSETRON HCL 4 MG/2ML IJ SOLN
INTRAMUSCULAR | Status: AC
  Filled 2020-07-15: qty 2

## 2020-07-15 MED ORDER — PROPOFOL 10 MG/ML IV BOLUS
INTRAVENOUS | Status: DC | PRN
  Administered 2020-07-15: 130 mg via INTRAVENOUS

## 2020-07-15 MED ORDER — EPHEDRINE 5 MG/ML INJ
INTRAVENOUS | Status: AC
  Filled 2020-07-15: qty 10

## 2020-07-15 MED ORDER — ONDANSETRON HCL 4 MG/2ML IJ SOLN: 4.0000 mg | Freq: Once | INTRAMUSCULAR | Status: DC | PRN

## 2020-07-15 MED ORDER — ACETAMINOPHEN 10 MG/ML IV SOLN
INTRAVENOUS | Status: DC | PRN
  Administered 2020-07-15: 1000 mg via INTRAVENOUS

## 2020-07-15 MED ORDER — FAMOTIDINE 20 MG PO TABS: 20.0000 mg | ORAL_TABLET | Freq: Once | ORAL | Status: DC

## 2020-07-15 MED ORDER — GLYCOPYRROLATE 0.2 MG/ML IJ SOLN
INTRAMUSCULAR | Status: AC
  Filled 2020-07-15: qty 1

## 2020-07-15 MED ORDER — ROCURONIUM BROMIDE 100 MG/10ML IV SOLN
INTRAVENOUS | Status: DC | PRN
  Administered 2020-07-15: 50 mg via INTRAVENOUS

## 2020-07-15 MED ORDER — FENTANYL CITRATE (PF) 100 MCG/2ML IJ SOLN: 25.0000 ug | INTRAMUSCULAR | Status: DC | PRN

## 2020-07-15 MED ORDER — CHLORHEXIDINE GLUCONATE 0.12 % MT SOLN
15.0000 mL | Freq: Once | OROMUCOSAL | Status: AC
  Administered 2020-07-15: 15 mL via OROMUCOSAL

## 2020-07-15 MED ORDER — FENTANYL CITRATE (PF) 100 MCG/2ML IJ SOLN
INTRAMUSCULAR | Status: AC
  Filled 2020-07-15: qty 2

## 2020-07-15 MED ORDER — PHENYLEPHRINE HCL (PRESSORS) 10 MG/ML IV SOLN
INTRAVENOUS | Status: DC | PRN
  Administered 2020-07-15 (×3): 100 ug via INTRAVENOUS

## 2020-07-15 MED ORDER — SUGAMMADEX SODIUM 500 MG/5ML IV SOLN
INTRAVENOUS | Status: DC | PRN
  Administered 2020-07-15: 300 mg via INTRAVENOUS

## 2020-07-15 SURGICAL SUPPLY — 31 items
BAG DRAIN CYSTO-URO LG1000N (MISCELLANEOUS) ×2 IMPLANT
BRUSH SCRUB EZ 1% IODOPHOR (MISCELLANEOUS) ×2 IMPLANT
CATH URET FLEX-TIP 2 LUMEN 10F (CATHETERS) IMPLANT
CATH URETL 5X70 OPEN END (CATHETERS) IMPLANT
CNTNR SPEC 2.5X3XGRAD LEK (MISCELLANEOUS)
CONT SPEC 4OZ STER OR WHT (MISCELLANEOUS)
CONT SPEC 4OZ STRL OR WHT (MISCELLANEOUS)
CONTAINER SPEC 2.5X3XGRAD LEK (MISCELLANEOUS) IMPLANT
DRAPE UTILITY 15X26 TOWEL STRL (DRAPES) ×2 IMPLANT
DRSG TEGADERM 2-3/8X2-3/4 SM (GAUZE/BANDAGES/DRESSINGS) ×2 IMPLANT
GLOVE SURG UNDER POLY LF SZ7.5 (GLOVE) ×2 IMPLANT
GOWN STRL REUS W/ TWL LRG LVL3 (GOWN DISPOSABLE) ×1 IMPLANT
GOWN STRL REUS W/ TWL XL LVL3 (GOWN DISPOSABLE) ×1 IMPLANT
GOWN STRL REUS W/TWL LRG LVL3 (GOWN DISPOSABLE) ×2
GOWN STRL REUS W/TWL XL LVL3 (GOWN DISPOSABLE) ×2
GUIDEWIRE STR DUAL SENSOR (WIRE) ×4 IMPLANT
INFUSOR MANOMETER BAG 3000ML (MISCELLANEOUS) ×2 IMPLANT
KIT TURNOVER CYSTO (KITS) ×2 IMPLANT
PACK CYSTO AR (MISCELLANEOUS) ×2 IMPLANT
SET CYSTO W/LG BORE CLAMP LF (SET/KITS/TRAYS/PACK) ×2 IMPLANT
SHEATH URETERAL 12FRX35CM (MISCELLANEOUS) ×2 IMPLANT
SOL .9 NS 3000ML IRR  AL (IV SOLUTION) ×1
SOL .9 NS 3000ML IRR AL (IV SOLUTION) ×1
SOL .9 NS 3000ML IRR UROMATIC (IV SOLUTION) ×1 IMPLANT
STENT URET 6FRX24 CONTOUR (STENTS) ×2 IMPLANT
STENT URET 6FRX26 CONTOUR (STENTS) IMPLANT
SURGILUBE 2OZ TUBE FLIPTOP (MISCELLANEOUS) ×2 IMPLANT
SYR 10ML LL (SYRINGE) ×2 IMPLANT
TRACTIP FLEXIVA PULSE ID 200 (Laser) ×2 IMPLANT
VALVE UROSEAL ADJ ENDO (VALVE) IMPLANT
WATER STERILE IRR 1000ML POUR (IV SOLUTION) ×2 IMPLANT

## 2020-07-15 NOTE — Interval H&P Note (Signed)
UROLOGY H&P UPDATE  Agree with prior H&P dated 06/18/2020 by Dr. Jeffie Pollock.  4 mm right UVJ stone and large 1.5 cm right lower pole stone with sepsis from urinary source who previously underwent emergent stent placement.  Here today for definitive management  Cardiac: RRR Lungs: CTA bilaterally  Laterality: Right Procedure: Ureteroscopy, laser lithotripsy, stent placement  Urine: 3/2 culture no significant growth  We specifically discussed the risks ureteroscopy including bleeding, infection/sepsis, stent related symptoms including flank pain/urgency/frequency/incontinence/dysuria, ureteral injury, inability to access stone, or need for staged or additional procedures.   Billey Co, MD 07/15/2020

## 2020-07-15 NOTE — Discharge Instructions (Signed)

## 2020-07-15 NOTE — Anesthesia Procedure Notes (Signed)
Procedure Name: Intubation Date/Time: 07/15/2020 12:17 PM Performed by: Lowry Bowl, CRNA Pre-anesthesia Checklist: Patient identified, Emergency Drugs available, Suction available and Patient being monitored Patient Re-evaluated:Patient Re-evaluated prior to induction Oxygen Delivery Method: Circle system utilized Preoxygenation: Pre-oxygenation with 100% oxygen Induction Type: IV induction Ventilation: Mask ventilation without difficulty Laryngoscope Size: 3 and McGraph Grade View: Grade I Tube type: Oral Tube size: 7.0 mm Number of attempts: 1 Airway Equipment and Method: Stylet and Video-laryngoscopy Placement Confirmation: ETT inserted through vocal cords under direct vision,  positive ETCO2 and breath sounds checked- equal and bilateral Secured at: 21 cm Tube secured with: Tape Dental Injury: Teeth and Oropharynx as per pre-operative assessment

## 2020-07-15 NOTE — Transfer of Care (Signed)
Immediate Anesthesia Transfer of Care Note  Patient: Heather Simmons  Procedure(s) Performed: CYSTOSCOPY/URETEROSCOPY/HOLMIUM LASER/STENT PLACEMENT (Right )  Patient Location: PACU  Anesthesia Type:General  Level of Consciousness: sedated  Airway & Oxygen Therapy: Patient Spontanous Breathing  Post-op Assessment: Report given to RN and Post -op Vital signs reviewed and stable  Post vital signs: Reviewed and stable  Last Vitals:  Vitals Value Taken Time  BP 131/84 07/15/20 1338  Temp 37.2 C 07/15/20 1338  Pulse 83 07/15/20 1338  Resp 10 07/15/20 1338  SpO2 99 % 07/15/20 1338    Last Pain:  Vitals:   07/15/20 1338  PainSc: 0-No pain         Complications: No complications documented.

## 2020-07-15 NOTE — Op Note (Signed)
Date of procedure: 07/15/20  Preoperative diagnosis:  1. Right ureteral stone 2. Right renal stone  Postoperative diagnosis:  1. Right renal stone  Procedure: 1. Cystoscopy, right ureteroscopy, laser lithotripsy, basket extraction of stones, right retrograde pyelogram with intraoperative interpretation, right ureteral stent placement  Surgeon: Nickolas Madrid, MD  Anesthesia: General  Complications: None  Intraoperative findings:  1.  Normal cystoscopy 2.  Right distal ureteral stone had passed spontaneously, right renal stone fragmented and basket extracted 3.  Uncomplicated stent placement  EBL: Minimal  Specimens: Stone for analysis  Drains: Right 6 French by 24 cm ureteral stent  Indication: Heather Simmons is a 70 y.o. patient with 4 mm right distal ureteral stone and large renal stone who previously presented with sepsis from urinary source and underwent emergent stent placement at that time.  After reviewing the management options for treatment, they elected to proceed with the above surgical procedure(s). We have discussed the potential benefits and risks of the procedure, side effects of the proposed treatment, the likelihood of the patient achieving the goals of the procedure, and any potential problems that might occur during the procedure or recuperation. Informed consent has been obtained.  Description of procedure:  The patient was taken to the operating room and general anesthesia was induced. SCDs were placed for DVT prophylaxis. The patient was placed in the dorsal lithotomy position, prepped and draped in the usual sterile fashion, and preoperative antibiotics(Ancef) were administered. A preoperative time-out was performed.   A 21 French rigid cystoscope was used to intubate the urethra and thorough cystoscopy was performed.  The bladder was grossly normal.  The right ureteral stent was grasped and pulled to the meatus and a sensor wire was used to intubate the stent  and passed up to the kidney under fluoroscopic vision, and the old stent removed.  A semirigid ureteroscope was advanced alongside the wire and there were no stone seen in the right ureter.  A second wire was added through the ureteroscope and it was removed.  A 12/14 French by 35 cm ureteral access sheath was advanced over the wire under fluoroscopic vision up to the kidney.  The single channel digital flexible ureteroscope was advanced through the sheath and thorough pyeloscopy revealed a 1.5 cm black stone in the lower pole.  This was fragmented to pieces, and a basket used to extract the largest through the sheath.  This was sent for analysis.  The few remaining small fragments were dusted to <1 mm.  Thorough pyeloscopy revealed no other abnormalities.  A retrograde pyelogram was performed from the proximal ureter and showed no extravasation or filling defects.  Careful pullback ureteroscopy demonstrated no ureteral injury or residual fragments.  The rigid cystoscope was backloaded over the wire and a 6 [place] by 24 cm ureteral stent was uneventfully placed.  The bladder was drained and this concluded our procedure.  Disposition: Stable to PACU  Plan: Bactrim prophylaxis while stent in place Follow-up for stent removal in 5 to 10 days  Nickolas Madrid, MD

## 2020-07-15 NOTE — Anesthesia Postprocedure Evaluation (Signed)
Anesthesia Post Note  Patient: Heather Simmons  Procedure(s) Performed: CYSTOSCOPY/URETEROSCOPY/HOLMIUM LASER/STENT PLACEMENT (Right )  Patient location during evaluation: PACU Anesthesia Type: General Level of consciousness: awake and alert Pain management: pain level controlled Vital Signs Assessment: post-procedure vital signs reviewed and stable Respiratory status: spontaneous breathing, nonlabored ventilation, respiratory function stable and patient connected to nasal cannula oxygen Cardiovascular status: blood pressure returned to baseline and stable Postop Assessment: no apparent nausea or vomiting Anesthetic complications: no   No complications documented.   Last Vitals:  Vitals:   07/15/20 1430 07/15/20 1440  BP:  140/80  Pulse:  76  Resp:  18  Temp: 36.7 C (!) 36.4 C  SpO2:  97%    Last Pain:  Vitals:   07/15/20 1440  TempSrc: Temporal  PainSc: 0-No pain                 Martha Clan

## 2020-07-15 NOTE — Anesthesia Preprocedure Evaluation (Signed)
Anesthesia Evaluation  Patient identified by MRN, date of birth, ID band Patient awake    Reviewed: Allergy & Precautions, NPO status , Patient's Chart, lab work & pertinent test results  History of Anesthesia Complications Negative for: history of anesthetic complications  Airway Mallampati: III  TM Distance: >3 FB     Dental  (+) Dental Advidsory Given, Missing, Poor Dentition   Pulmonary neg pulmonary ROS, neg sleep apnea, neg COPD,  O2 sats in low 90s on [place]          Cardiovascular hypertension, Pt. on medications and Pt. on home beta blockers (-) angina(-) Past MI, (-) Cardiac Stents and (-) CHF (-) dysrhythmias (-) Valvular Problems/Murmurs (murmur, remote past)     Neuro/Psych neg Seizures negative neurological ROS     GI/Hepatic Neg liver ROS, neg GERD  ,  Endo/Other  neg diabetesMorbid obesity  Renal/GU Renal disease (stones, uro sepsis)     Musculoskeletal   Abdominal   Peds  Hematology   Anesthesia Other Findings Past Medical History: No date: Anginal pain ([place]) No date: Complication of anesthesia     Comment:  Nausea postoperatively with cataract surgery No date: Fluid collection (edema) in the arms, legs, hands and feet No date: Gout No date: Headache     Comment:  MIGRAINES No date: Heart murmur No date: History of kidney stones No date: Hypertension No date: Palpitations   Reproductive/Obstetrics                             Anesthesia Physical  Anesthesia Plan  ASA: III  Anesthesia Plan: General   Post-op Pain Management:    Induction: Intravenous  PONV Risk Score and Plan: 3 and Ondansetron, Dexamethasone and Treatment may vary due to age or medical condition  Airway Management Planned: Oral ETT  Additional Equipment:   Intra-op Plan:   Post-operative Plan: Extubation in OR  Informed Consent: I have reviewed the patients History and Physical, chart,  labs and discussed the procedure including the risks, benefits and alternatives for the proposed anesthesia with the patient or authorized representative who has indicated his/her understanding and acceptance.       Plan Discussed with:   Anesthesia Plan Comments: (Pt understands risks of need for post-op ventilation, as well as risks of heart, BP and other respiratory problems and wishes to proceed. )        Anesthesia Quick Evaluation

## 2020-07-18 ENCOUNTER — Telehealth: Payer: Self-pay | Admitting: Urology

## 2020-07-18 NOTE — Telephone Encounter (Signed)
-----   Message from Billey Co, MD sent at 07/15/2020  1:30 PM EST ----- Regarding: stent removal Please schedule stent removal in 5 to 10 days, thank you  Nickolas Madrid, MD 07/15/2020

## 2020-07-18 NOTE — Telephone Encounter (Signed)
Done

## 2020-07-20 ENCOUNTER — Other Ambulatory Visit: Payer: Self-pay

## 2020-07-20 ENCOUNTER — Encounter: Payer: Self-pay | Admitting: Urology

## 2020-07-20 ENCOUNTER — Ambulatory Visit (INDEPENDENT_AMBULATORY_CARE_PROVIDER_SITE_OTHER): Payer: Medicare Other | Admitting: Urology

## 2020-07-20 VITALS — BP 127/82 | HR 91 | Ht 65.0 in | Wt 273.0 lb

## 2020-07-20 DIAGNOSIS — N201 Calculus of ureter: Secondary | ICD-10-CM

## 2020-07-20 LAB — MICROSCOPIC EXAMINATION
Epithelial Cells (non renal): >10 /hpf — AB (ref 0–10)
RBC, Urine: >30 /hpf — AB (ref 0–2)

## 2020-07-20 LAB — URINALYSIS, COMPLETE
Bilirubin, UA: NEGATIVE
Glucose, UA: NEGATIVE
Nitrite, UA: NEGATIVE
Specific Gravity, UA: 1.02 (ref 1.005–1.030)
Urobilinogen, Ur: 0.2 mg/dL (ref 0.2–1.0)
pH, UA: 5.5 (ref 5.0–7.5)

## 2020-07-20 NOTE — Progress Notes (Signed)
Cystoscopy Procedure Note:  Indication: Stent removal s/p 07/15/2020 right ureteroscopy and laser lithotripsy for 1.5 cm stone with basket extraction  After informed consent and discussion of the procedure and its risks, Heather Simmons was positioned and prepped in the standard fashion. Cystoscopy was performed with a flexible cystoscope. The stent was grasped with flexible graspers and removed in its entirety. The patient tolerated the procedure well.  Findings: Uncomplicated stent removal  Assessment and Plan: RTC 6 months with KUB prior  We discussed general stone prevention strategies including adequate hydration with goal of producing 2.5 L of urine daily, increasing citric acid intake, increasing calcium intake during high oxalate meals, minimizing animal protein, and decreasing salt intake. Information about dietary recommendations given today.     Billey Co, MD 07/20/2020

## 2020-07-22 LAB — CALCULI, WITH PHOTOGRAPH (CLINICAL LAB)
Calcium Oxalate Monohydrate: 100 %
Weight Calculi: 104 mg

## 2020-07-27 ENCOUNTER — Telehealth: Payer: Self-pay

## 2020-07-27 NOTE — Telephone Encounter (Signed)
-----   Message from Billey Co, MD sent at 07/27/2020  9:06 AM EDT ----- Joaquim Lai was calcium oxalate, the most common type of stone.  Follow-up as scheduled  Nickolas Madrid, MD 07/27/2020

## 2020-07-27 NOTE — Telephone Encounter (Signed)
Called pt informed her of the information below. Pt gave verbal understanding. Pt requests information on stone prevention. Information given to patient verbally as well as mychart.

## 2021-01-25 ENCOUNTER — Ambulatory Visit: Payer: Medicare Other | Admitting: Urology

## 2021-02-01 ENCOUNTER — Other Ambulatory Visit: Payer: Self-pay

## 2021-02-01 ENCOUNTER — Encounter: Payer: Self-pay | Admitting: Urology

## 2021-02-01 ENCOUNTER — Ambulatory Visit
Admission: RE | Admit: 2021-02-01 | Discharge: 2021-02-01 | Disposition: A | Discharge status: Home / Self Care | Payer: Medicare Other | Source: Ambulatory Visit | Attending: Urology | Admitting: Urology

## 2021-02-01 ENCOUNTER — Ambulatory Visit
Admission: RE | Admit: 2021-02-01 | Discharge: 2021-02-01 | Disposition: A | Discharge status: Home / Self Care | Payer: Medicare Other | Attending: Urology | Admitting: Urology

## 2021-02-01 ENCOUNTER — Ambulatory Visit: Payer: Medicare Other | Admitting: Urology

## 2021-02-01 VITALS — BP 126/76 | HR 79 | Ht 66.0 in | Wt 280.0 lb

## 2021-02-01 DIAGNOSIS — N200 Calculus of kidney: Secondary | ICD-10-CM | POA: Diagnosis not present

## 2021-02-01 DIAGNOSIS — N201 Calculus of ureter: Secondary | ICD-10-CM | POA: Diagnosis present

## 2021-02-01 NOTE — Progress Notes (Signed)
   02/01/2021 9:51 AM   Eliane Decree 08/11/1950 [id number]  Reason for visit: Follow up nephrolithiasis  HPI: I saw Ms. Kovack for follow-up of nephrolithiasis.  70 year old female who previously presented in February 2022 with an infected right distal ureteral stone and sepsis from urinary source.  She underwent urgent stent placement at that time, and ultimately follow-up right ureteroscopy and laser lithotripsy of a large right lower pole stone.  Stone type was calcium oxalate.  She denies any problems since that time, and has been doing well.  No gross hematuria or stone episodes, no flank pain.  She has been on low-dose Topamax long-term for migraines which is the only medication that seems to help, and she is not interested in finding an alternative for this medication.  We discussed the relationship between Topamax and calcium phosphate stones.  I personally viewed and interpreted her KUB today that shows no evidence of stone disease.  We discussed general stone prevention strategies including adequate hydration with goal of producing 2.5 L of urine daily, increasing citric acid intake, increasing calcium intake during high oxalate meals, minimizing animal protein, and decreasing salt intake. Information about dietary recommendations given today.   Follow-up with urology as needed  Billey Co, [place] [address] [place], [place] [address] [phone]

## 2021-02-01 NOTE — Patient Instructions (Signed)
Dietary Guidelines to Help Prevent Kidney Stones Kidney stones are deposits of minerals and salts that form inside your kidneys. Your risk of developing kidney stones may be greater depending on your diet, your lifestyle, the medicines you take, and whether you have certain medical conditions. Most people can lower their chances of developing kidney stones by following the instructions below. Your dietitian may give you more specific instructions depending on your overall health and the type of kidney stones you tend to develop. What are tips for following this plan? Reading food labels  Choose foods with "no salt added" or "low-salt" labels. Limit your salt (sodium) intake to less than 1,500 mg a day. Choose foods with calcium for each meal and snack. Try to eat about 300 mg of calcium at each meal. Foods that contain 200-500 mg of calcium a serving include: 8 oz (237 mL) of milk, calcium-fortifiednon-dairy milk, and calcium-fortifiedfruit juice. Calcium-fortified means that calcium has been added to these drinks. 8 oz (237 mL) of kefir, yogurt, and soy yogurt. 4 oz (114 g) of tofu. 1 oz (28 g) of cheese. 1 cup (150 g) of dried figs. 1 cup (91 g) of cooked broccoli. One 3 oz (85 g) can of sardines or mackerel. Most people need 1,000-1,500 mg of calcium a day. Talk to your dietitian about how much calcium is recommended for you. Shopping Buy plenty of fresh fruits and vegetables. Most people do not need to avoid fruits and vegetables, even if these foods contain nutrients that may contribute to kidney stones. When shopping for convenience foods, choose: Whole pieces of fruit. Pre-made salads with dressing on the side. Low-fat fruit and yogurt smoothies. Avoid buying frozen meals or prepared deli foods. These can be high in sodium. Look for foods with live cultures, such as yogurt and kefir. Choose high-fiber grains, such as whole-wheat breads, oat bran, and wheat cereals. Cooking Do not add  salt to food when cooking. Place a salt shaker on the table and allow each person to add his or her own salt to taste. Use vegetable protein, such as beans, textured vegetable protein (TVP), or tofu, instead of meat in pasta, casseroles, and soups. Meal planning Eat less salt, if told by your dietitian. To do this: Avoid eating processed or pre-made food. Avoid eating fast food. Eat less animal protein, including cheese, meat, poultry, or fish, if told by your dietitian. To do this: Limit the number of times you have meat, poultry, fish, or cheese each week. Eat a diet free of meat at least 2 days a week. Eat only one serving each day of meat, poultry, fish, or seafood. When you prepare animal protein, cut pieces into small portion sizes. For most meat and fish, one serving is about the size of the palm of your hand. Eat at least five servings of fresh fruits and vegetables each day. To do this: Keep fruits and vegetables on hand for snacks. Eat one piece of fruit or a handful of berries with breakfast. Have a salad and fruit at lunch. Have two kinds of vegetables at dinner. Limit foods that are high in a substance called oxalate. These include: Spinach (cooked), rhubarb, beets, sweet potatoes, and Swiss chard. Peanuts. Potato chips, french fries, and baked potatoes with skin on. Nuts and nut products. Chocolate. If you regularly take a diuretic medicine, make sure to eat at least 1 or 2 servings of fruits or vegetables that are high in potassium each day. These include: Avocado. Banana. Orange, prune,   carrot, or tomato juice. Baked potato. Cabbage. Beans and split peas. Lifestyle  Drink enough fluid to keep your urine pale yellow. This is the most important thing you can do. Spread your fluid intake throughout the day. If you drink alcohol: Limit how much you use to: 0-1 drink a day for women who are not pregnant. 0-2 drinks a day for men. Be aware of how much alcohol is in your  drink. In the [place] one drink equals one 12 oz bottle of beer (355 mL), one 5 oz glass of wine (148 mL), or one 1 oz glass of hard liquor (44 mL). Lose weight if told by your health care provider. Work with your dietitian to find an eating plan and weight loss strategies that work best for you. General information Talk to your health care provider and dietitian about taking daily supplements. You may be told the following depending on your health and the cause of your kidney stones: Not to take supplements with vitamin C. To take a calcium supplement. To take a daily probiotic supplement. To take other supplements such as magnesium, fish oil, or vitamin B6. Take over-the-counter and prescription medicines only as told by your health care provider. These include supplements. What foods should I limit? Limit your intake of the following foods, or eat them as told by your dietitian. Vegetables Spinach. Rhubarb. Beets. Canned vegetables. Pickles. Olives. Baked potatoes with skin. Grains Wheat bran. Baked goods. Salted crackers. Cereals high in sugar. Meats and other proteins Nuts. Nut butters. Large portions of meat, poultry, or fish. Salted, precooked, or cured meats, such as sausages, meat loaves, and hot dogs. Dairy Cheese. Beverages Regular soft drinks. Regular vegetable juice. Seasonings and condiments Seasoning blends with salt. Salad dressings. Soy sauce. Ketchup. Barbecue sauce. Other foods Canned soups. Canned pasta sauce. Casseroles. Pizza. Lasagna. Frozen meals. Potato chips. French fries. The items listed above may not be a complete list of foods and beverages you should limit. Contact a dietitian for more information. What foods should I avoid? Talk to your dietitian about specific foods you should avoid based on the type of kidney stones you have and your overall health. Fruits Grapefruit. The item listed above may not be a complete list of foods and beverages you should  avoid. Contact a dietitian for more information. Summary Kidney stones are deposits of minerals and salts that form inside your kidneys. You can lower your risk of kidney stones by making changes to your diet. The most important thing you can do is drink enough fluid. Drink enough fluid to keep your urine pale yellow. Talk to your dietitian about how much calcium you should have each day, and eat less salt and animal protein as told by your dietitian. This information is not intended to replace advice given to you by your health care provider. Make sure you discuss any questions you have with your health care provider. Document Revised: 04/16/2019 Document Reviewed: 04/16/2019 Elsevier Patient Education  2022 [employer] Inc[employer]  

## 2021-06-16 IMAGING — CT CT RENAL STONE PROTOCOL
2 of 4 series · 14 of 46 positions shown, 16 images · non-contrast
Comparison: None.

CLINICAL DATA: Abdominal pain with nausea and vomiting

EXAM:
CT ABDOMEN AND PELVIS WITHOUT CONTRAST
TECHNIQUE: Multidetector CT imaging of the abdomen and pelvis was performed
following the standard protocol without oral or IV contrast.

[Series 2: stone full standard · axial · 0.89mm/px · z∈[-476,-91]mm · 11 of 93 slices shown, 13 images]
[im 8/93  soft-tissue]
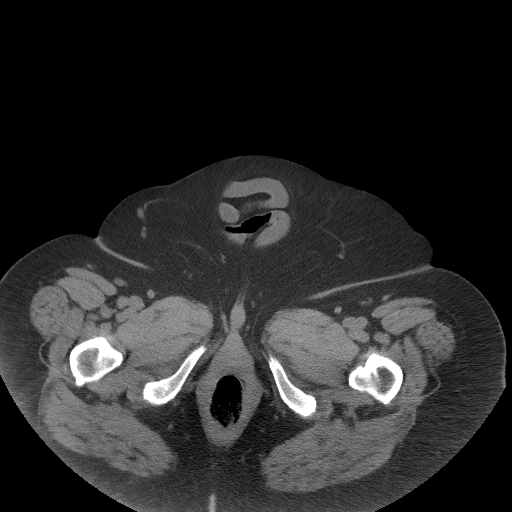
[im 8/93  bone]
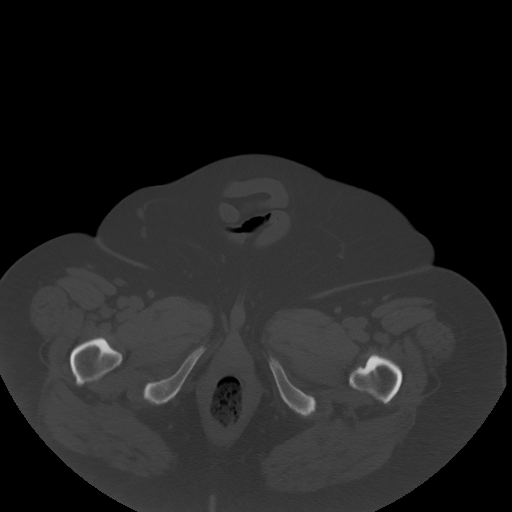
[im 15/93  soft-tissue]
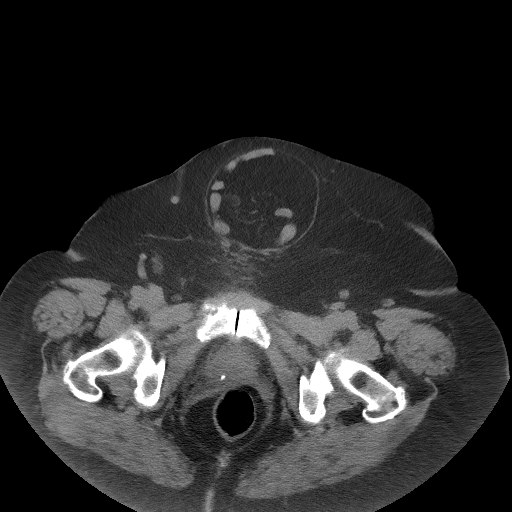
[im 22/93  soft-tissue]
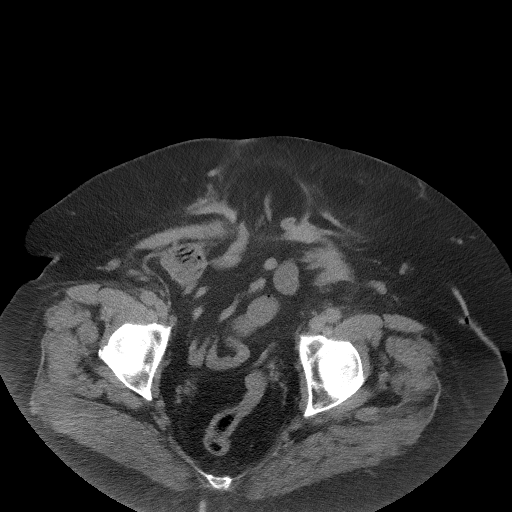
[im 29/93  soft-tissue]
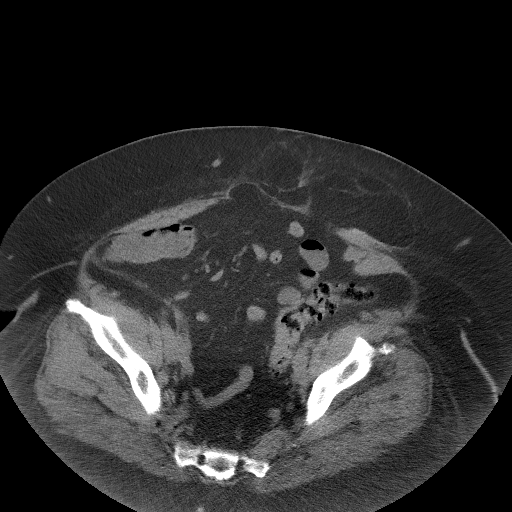
[im 39/93  soft-tissue]
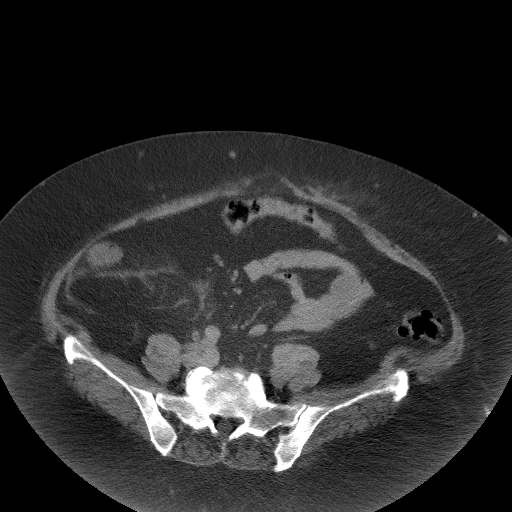
[im 47/93  soft-tissue]
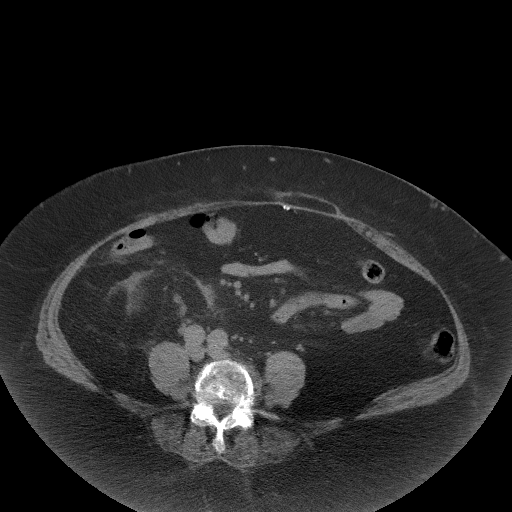
[im 54/93  soft-tissue]
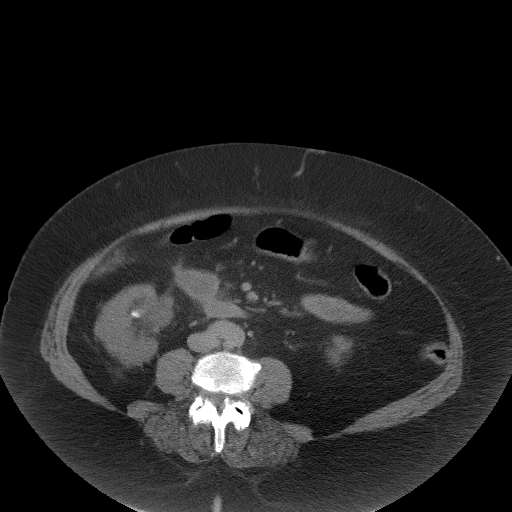
[im 64/93  soft-tissue]
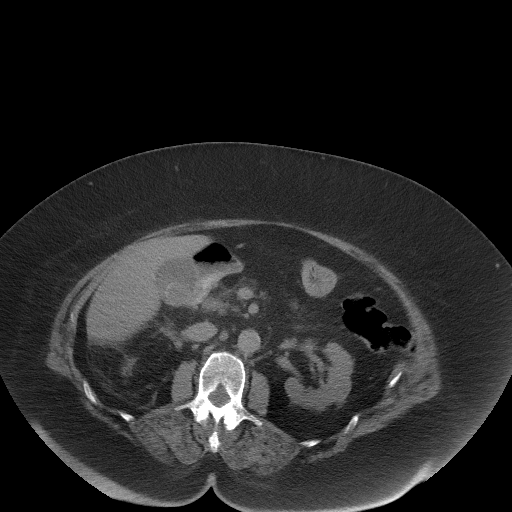
[im 71/93  soft-tissue]
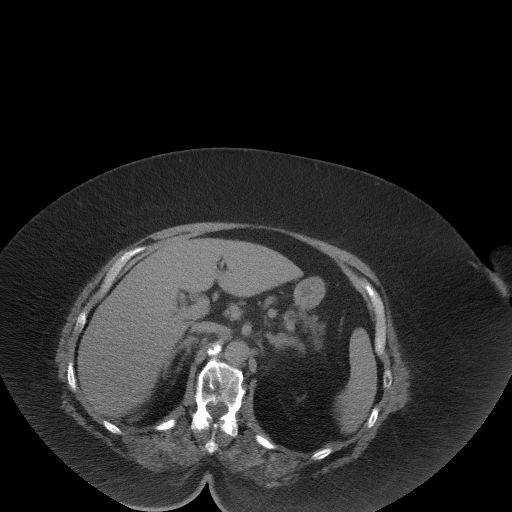
[im 71/93  bone]
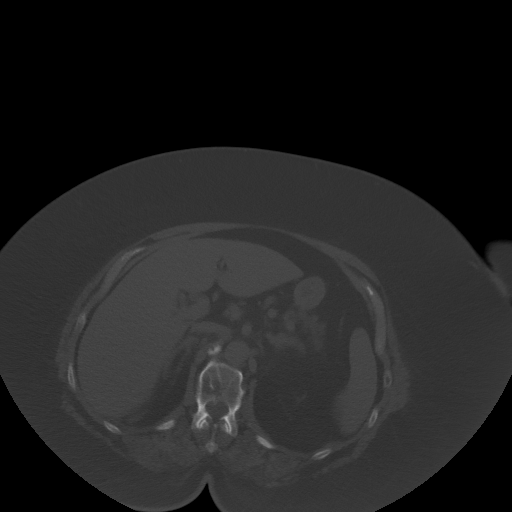
[im 78/93  soft-tissue]
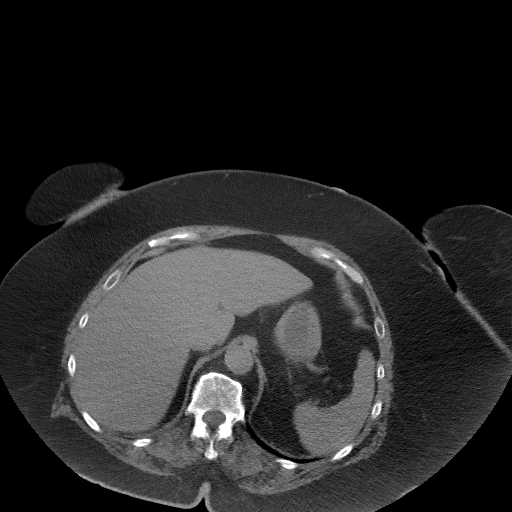
[im 85/93  soft-tissue]
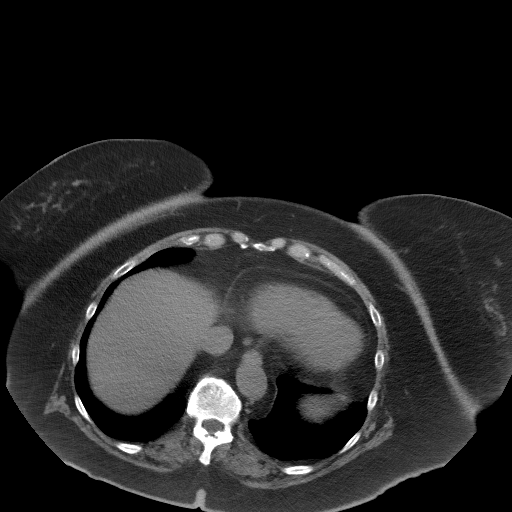

[Series 5: coronal · coronal · 0.93mm/px · 3 of 181 slices shown]
[im 61/181  soft-tissue]
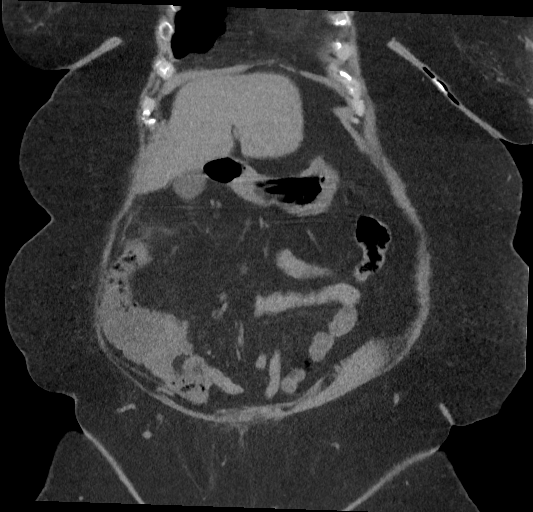
[im 81/181  soft-tissue]
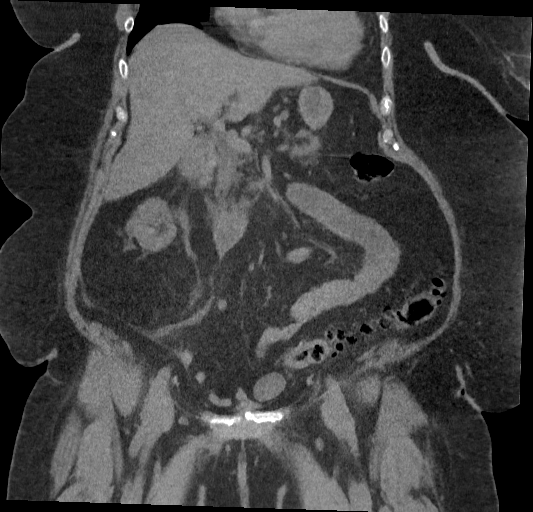
[im 101/181  soft-tissue]
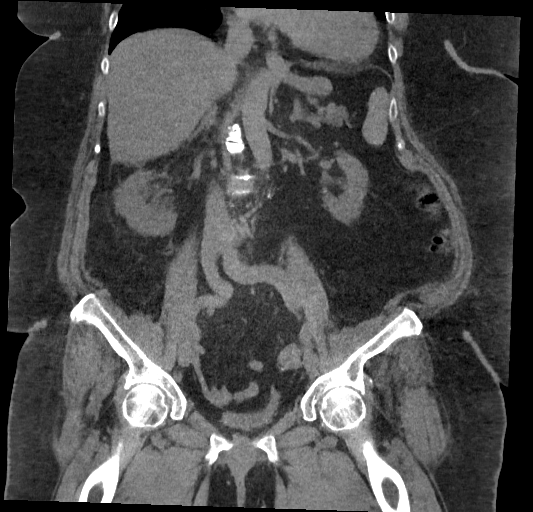

[14 of 46 positions shown; findings below may reference images not displayed]

FINDINGS: Lower chest: Lung bases are clear.

Hepatobiliary: There is hepatic steatosis. No focal liver lesions
are evident on this noncontrast enhanced study. There are laminated
gallstones within the gallbladder. The gallbladder wall does not
appear thickened. There is no appreciable biliary duct dilatation.

Pancreas: There is no appreciable pancreatic mass or inflammatory
focus. Fatty infiltration is noted in portions of the head of the
pancreas.

Spleen: No splenic lesions are evident.

Adrenals/Urinary Tract: Adrenals bilaterally appear normal. The
right kidney is edematous. There is a suspected parapelvic cyst on
the right measuring 3.5 x 2.7 cm. There is also mild hydronephrosis
on the right. There is a calculus in the lower pole of the right
kidney in the lateral pelvic region measuring 1.1 x 0.9 cm. No other
renal calculi are evident. There is subtle edema around the proximal
right ureter. No ureteral calculus is evident. There is perinephric
soft tissue thickening on the right with thickening of the mesentery
in the right mid abdomen extending inferiorly as well as medial and
lateral to the right kidney. No well-defined abscess seen in this
area. There is a calculus at the right ureterovesical junction
measuring 4 x 3 mm. There is thickening of the wall of the urinary
bladder.

Stomach/Bowel: There is no appreciable bowel wall or mesenteric
thickening. Loops of small bowel extend into a pelvic midline
ventral hernia without demonstrable bowel compromise. There is no
appreciable bowel obstruction. Terminal ileum appears normal. There
is no evident free air or portal venous air.

Vascular/Lymphatic: There are foci of aortic atherosclerosis. No
adenopathy is evident in the abdomen or pelvis.

Reproductive: Uterus is absent.  No adnexal mass lesions evident.

Other: No periappendiceal region inflammation. No ascites or abscess
in the abdomen pelvis. As stated above, there is a midline pelvic
ventral hernia containing loops of small bowel. This hernia at its
neck measures 3.8 cm from right to left dimension and 2.7 cm from
superior to inferior dimension. There is a hernia along the left
lower quadrant abdominal wall which contains fat but no bowel. This
hernia at its neck measures 5.1 cm from right to left dimension and
3.1 cm from superior to inferior dimension. There is a smaller
midline ventral hernia near the umbilicus which contains fat but no
bowel. This hernia measures 1.5 cm from right to left dimension at
its neck and 2.6 cm from superior to inferior dimension at its neck.
There are areas of scarring in the anterior lower abdominal and
pelvic wall regions.

Musculoskeletal: There is degenerative change in the lower thoracic
and lumbar regions. There is ankylosis at L4-5. There is spinal
stenosis at L2-3, L3-4, and L4-5 due to disc protrusion and bony
hypertrophy. No blastic or lytic bone lesions. No intramuscular
lesions are evident.
IMPRESSION: 1. Mild hydronephrosis on the right. There is a 4 x 3 mm calculus at
the right ureterovesical junction. The right kidney is edematous
with soft tissue stranding and thickening throughout perinephric
fascia on the right. Focal right renal abscess. The changes in the
mesentery on the right may be due to the ureteral calculus but also
could be indicative of a degree of pyelonephritis on the right.
There is also a calculus in the lower pole right kidney measuring
1.1 x 0.9 cm.

2. Thickening of the urinary bladder wall, a finding likely
indicative of a degree of cystitis.

3.  Cholelithiasis.  No gallbladder wall thickening.

4. Ventral hernias as noted. There are loops of small bowel in a
midline ventral pelvic hernia without bowel compromise. Other
hernias containing fat but no bowel.

5. Multilevel spinal stenosis due to disc protrusion and bony
hypertrophy.

6.  Aortic Atherosclerosis (HP595-5A5.5).

7.  Hepatic steatosis.

8.  Uterus absent.

## 2021-06-17 IMAGING — DX DG CHEST 1V PORT
1 series · 1 of 1 positions shown · non-contrast
Comparison: Radiograph 06/17/2020

CLINICAL DATA: Assess for interval change. Admitted patient with
acute pyelonephritis.

EXAM:
PORTABLE CHEST 1 VIEW

[chest pa]
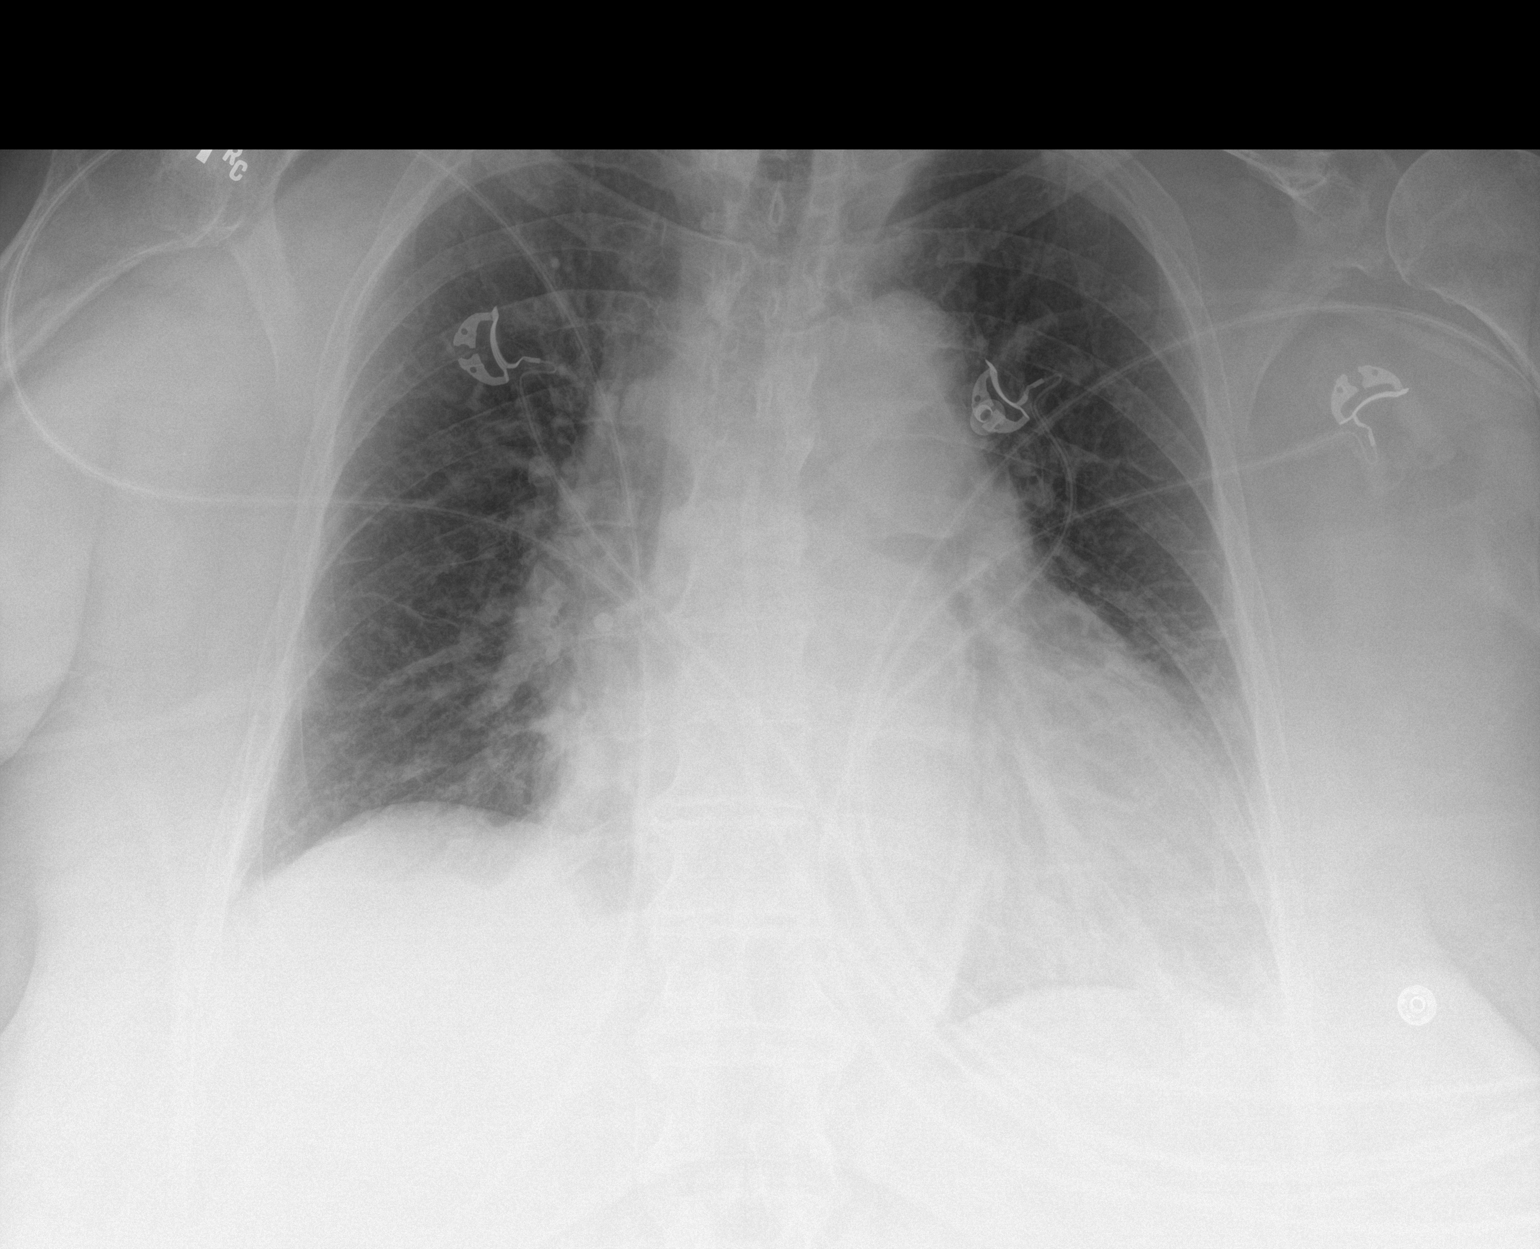

[1 of 1 positions shown; findings below may reference images not displayed]

FINDINGS: Stable lung volumes. Stable cardiomegaly. Unchanged mediastinal
contours. No focal airspace disease. No pulmonary edema. No pleural
fluid or pneumothorax. No acute osseous abnormalities are seen.
IMPRESSION: No significant interval change.  Stable cardiomegaly.

## 2022-01-30 IMAGING — CR DG ABDOMEN 1V
1 series · 3 of 3 positions shown · non-contrast
Comparison: 06/18/2020

CLINICAL DATA: Right renal calculus

EXAM:
ABDOMEN - 1 VIEW

[Series 1: dg abd 1 view · 0.14mm/px · 3 of 3 slices shown]
[im 1/3]
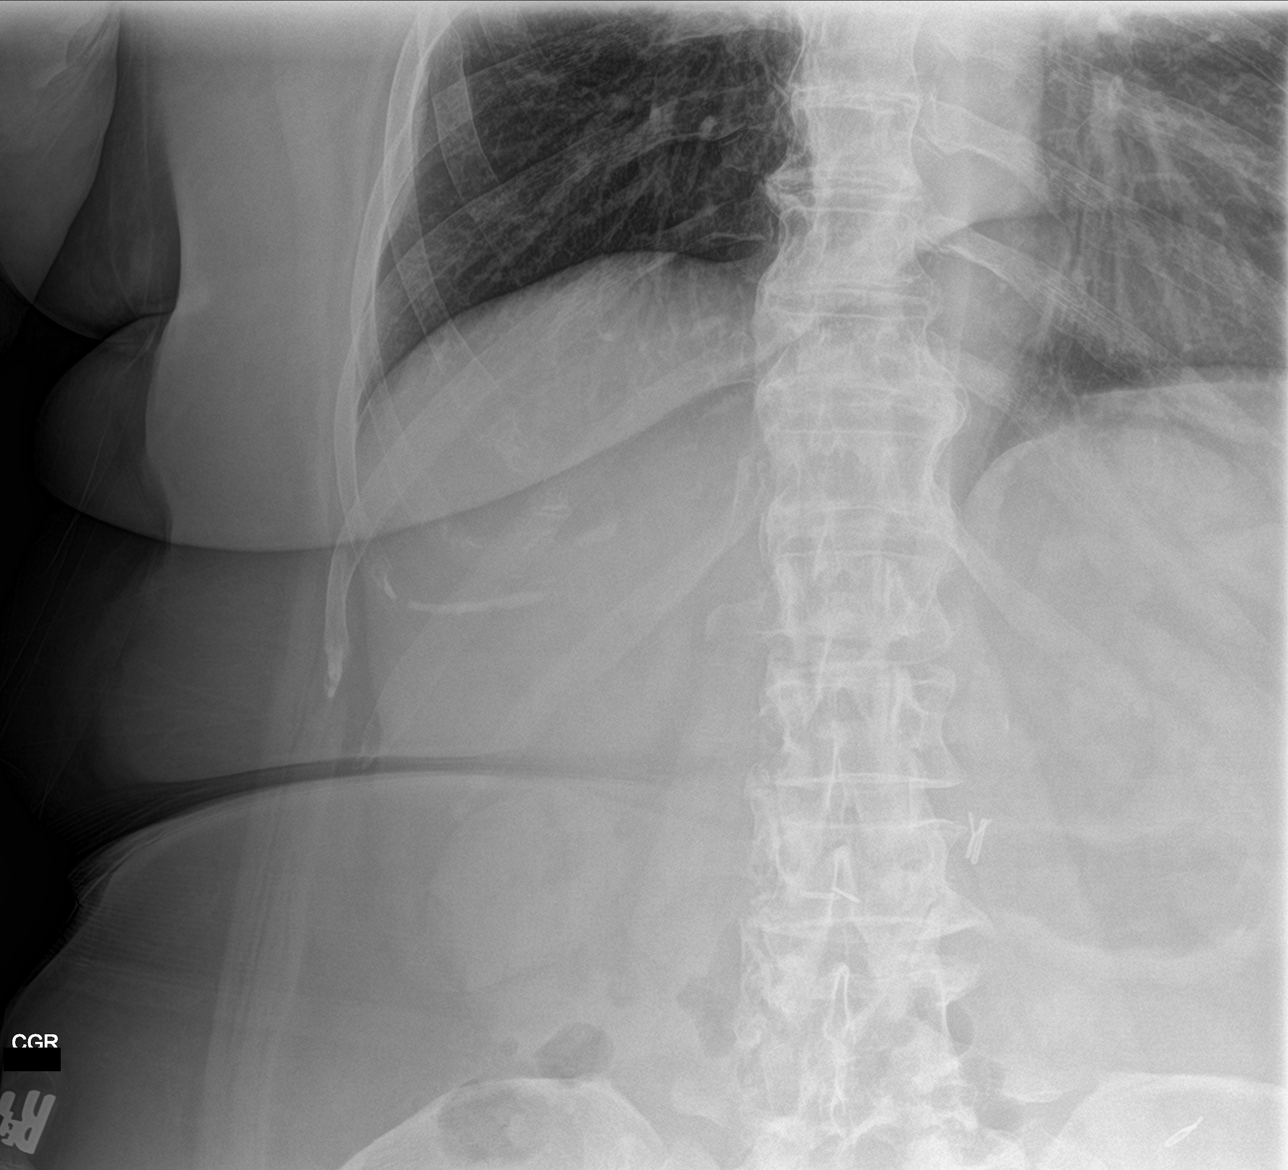
[im 2/3]
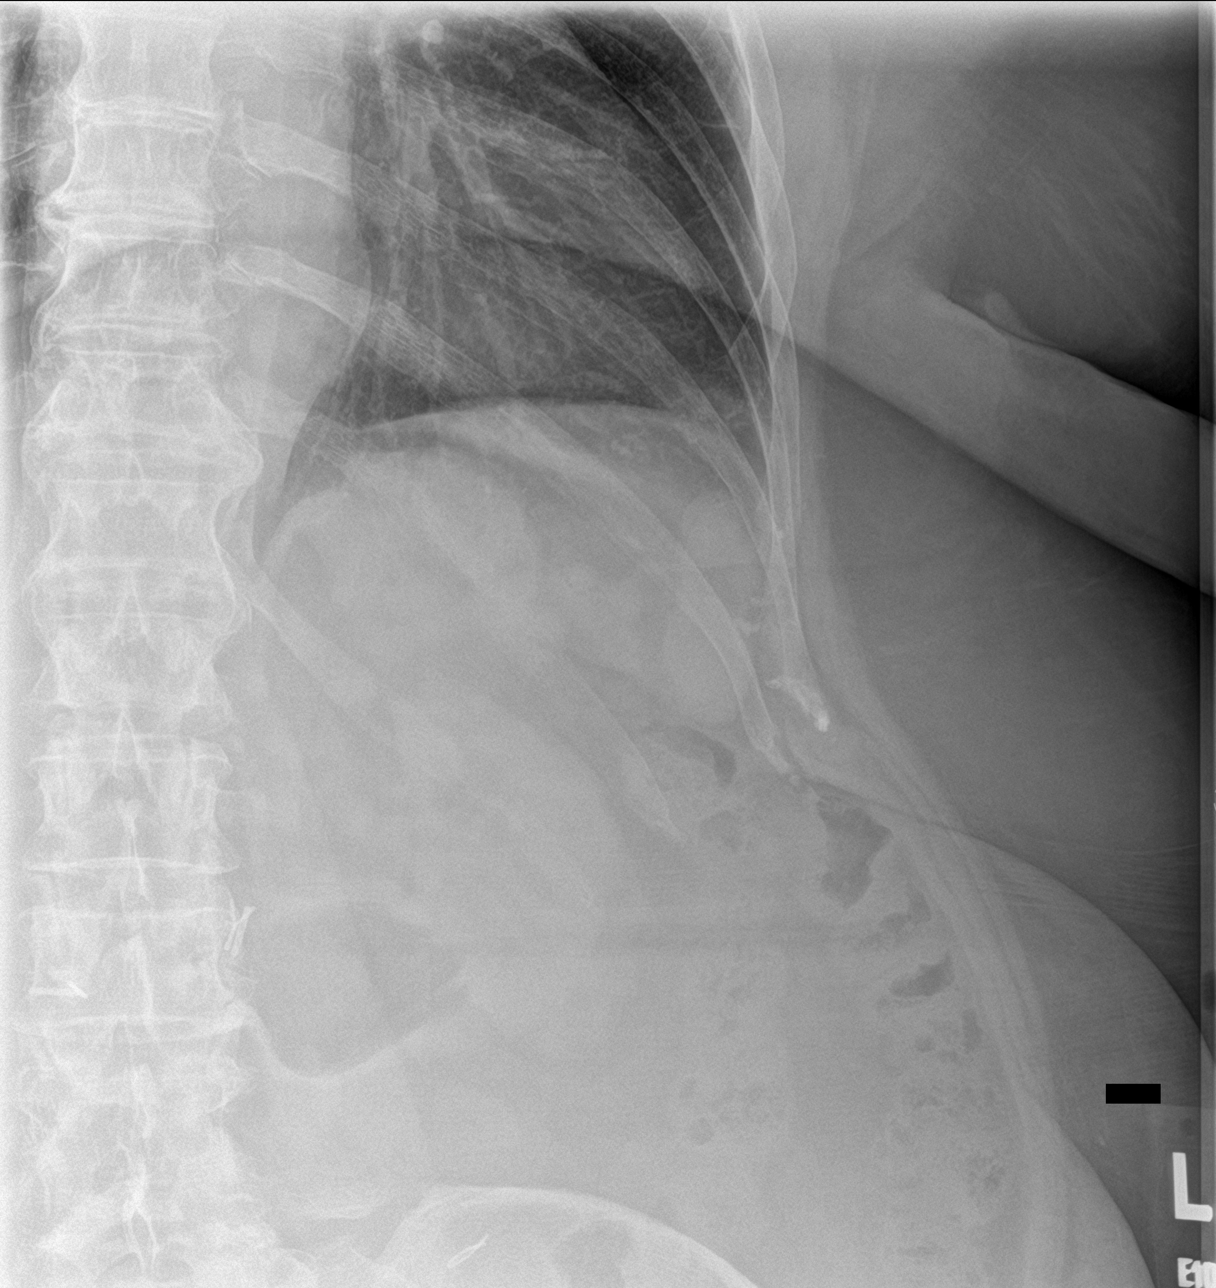
[im 3/3]
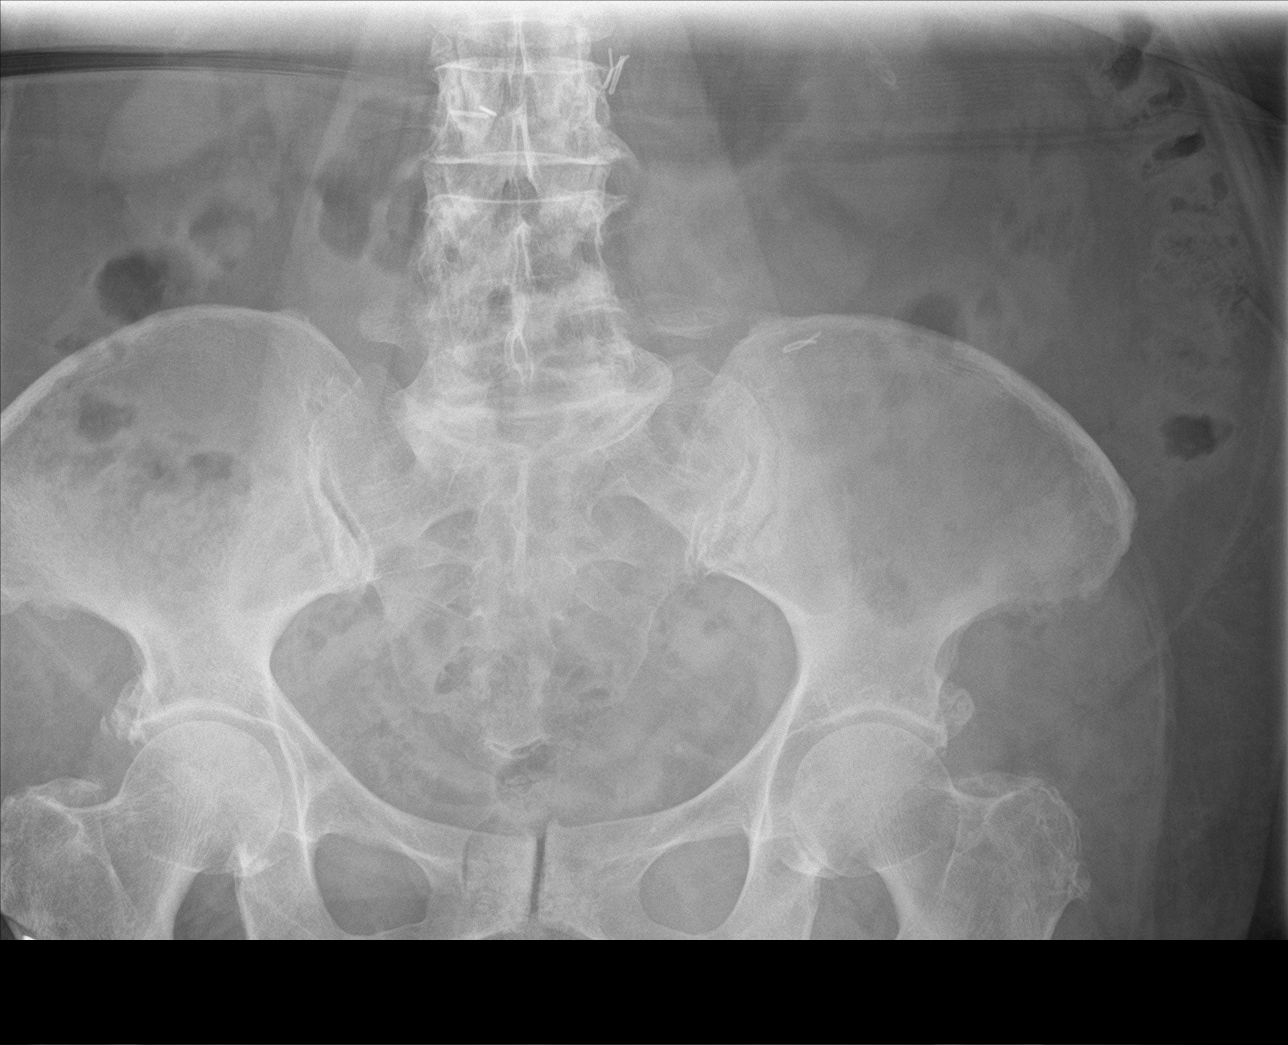

[3 of 3 positions shown; findings below may reference images not displayed]

FINDINGS: Three supine frontal views of the abdomen and pelvis are obtained.
No bowel obstruction or ileus. There are no radiopaque urinary tract
calculi. No abdominal masses. No acute bony abnormalities.
IMPRESSION: 1. No radiopaque urinary tract calculi.
2. Unremarkable bowel gas pattern.

## 2022-04-11 NOTE — H&P (Signed)
Pre-Procedure H&P   Patient ID: Heather Simmons is a 71 y.o. female.  Gastroenterology Provider: Annamaria Helling, DO  Referring Provider: Laurine Blazer, PA PCP: Dion Body, MD  Date: 04/12/2022  HPI Ms. Heather Simmons is a 71 y.o. female who presents today for Colonoscopy for Screening colonoscopy .  Patient reports last colonoscopy approximately 2007 which was normal per the patient, but records were not available.  She has previously dealt with longstanding constipation but this resolved with dietary changes.  Currently she is having daily bowel movement without melena or hematochezia.  She denies any family history of colon cancer or colon polyps.  Most recent lab work hemoglobin 14 MCV 96 close was 86,000.  She is status post hysterectomy. Does use naproxen/Aleve   Past Medical History:  Diagnosis Date   Anginal pain ([place])    Complication of anesthesia    Nausea postoperatively with cataract surgery   Fluid collection (edema) in the arms, legs, hands and feet    Gout    Headache    MIGRAINES   Heart murmur    History of kidney stones    Hypertension    Palpitations     Past Surgical History:  Procedure Laterality Date   ABDOMINAL HYSTERECTOMY     CATARACT EXTRACTION W/PHACO Right 10/04/2015   Procedure: CATARACT EXTRACTION PHACO AND INTRAOCULAR LENS PLACEMENT ([place]);  Surgeon: Birder Robson, MD;  Location: [facility];  Service: Ophthalmology;  Laterality: Right;  [place] 36.0 AP% 21.9 CDE 7.88 fluid pack lot # [id number]   CATARACT EXTRACTION W/PHACO Left 11/01/2015   Procedure: CATARACT EXTRACTION PHACO AND INTRAOCULAR LENS PLACEMENT ([facility]);  Surgeon: Birder Robson, MD;  Location: [facility];  Service: Ophthalmology;  Laterality: Left;  [place] 00:33 AP% 23.1 CDE 7.76 fluid pack lot # [id number]   CYSTOSCOPY WITH STENT PLACEMENT Right 06/18/2020   Procedure: CYSTOSCOPY WITH STENT PLACEMENT;  Surgeon: Irine Seal, MD;  Location: [facility];  Service: Urology;   Laterality: Right;   CYSTOSCOPY/URETEROSCOPY/HOLMIUM LASER/STENT PLACEMENT Right 07/15/2020   Procedure: CYSTOSCOPY/URETEROSCOPY/HOLMIUM LASER/STENT PLACEMENT;  Surgeon: Billey Co, MD;  Location: [facility];  Service: Urology;  Laterality: Right;   EYE SURGERY     OOPHORECTOMY      Family History No h/o GI disease or malignancy  Review of Systems  Constitutional:  Negative for activity change, appetite change, chills, diaphoresis, fatigue, fever and unexpected weight change.  HENT:  Negative for trouble swallowing and voice change.   Respiratory:  Negative for shortness of breath and wheezing.   Cardiovascular:  Negative for chest pain, palpitations and leg swelling.  Gastrointestinal:  Negative for abdominal distention, abdominal pain, anal bleeding, blood in stool, constipation, diarrhea, nausea, rectal pain and vomiting.  Musculoskeletal:  Negative for arthralgias and myalgias.  Skin:  Negative for color change and pallor.  Neurological:  Negative for dizziness, syncope and weakness.  Psychiatric/Behavioral:  Negative for confusion.   All other systems reviewed and are negative.    Medications No current facility-administered medications on file prior to encounter.   Current Outpatient Medications on File Prior to Encounter  Medication Sig Dispense Refill   amLODipine (NORVASC) 10 MG tablet Take 10 mg by mouth at bedtime.     carvedilol (COREG) 6.25 MG tablet Take 6.25 mg by mouth 2 (two) times daily with a meal.     losartan (COZAAR) 100 MG tablet Take 100 mg by mouth at bedtime.      Omega-3 Fatty Acids (FISH OIL) 1200 MG CAPS Take  1,200 mg by mouth daily.     topiramate (TOPAMAX) 25 MG tablet Take 25 mg by mouth at bedtime.      Pertinent medications related to GI and procedure were reviewed by me with the patient prior to the procedure   Current Facility-Administered Medications:    0.9 %  sodium chloride infusion, , Intravenous, Continuous, Annamaria Helling,  DO   lidocaine (PF) (XYLOCAINE) 1 % injection, , , ,       Allergies  Allergen Reactions   Lisinopril Other (See Comments)    Intolerance - HAs   Allergies were reviewed by me prior to the procedure  Objective   Body mass index is 38.62 kg/m. Vitals:   04/12/22 0916  BP: 137/89  Pulse: 97  Resp: 18  Temp: 97.9 F (36.6 C)  TempSrc: Temporal  SpO2: 97%  Weight: 118.6 kg  Height: '5\' 9"'$  (1.753 m)     Physical Exam Vitals and nursing note reviewed.  Constitutional:      General: She is not in acute distress.    Appearance: Normal appearance. She is obese. She is not ill-appearing, toxic-appearing or diaphoretic.  HENT:     Head: Normocephalic and atraumatic.     Nose: Nose normal.     Mouth/Throat:     Mouth: Mucous membranes are moist.     Pharynx: Oropharynx is clear.  Eyes:     General: No scleral icterus.    Extraocular Movements: Extraocular movements intact.  Cardiovascular:     Rate and Rhythm: Normal rate and regular rhythm.     Heart sounds: No murmur heard.    No friction rub. No gallop.  Pulmonary:     Effort: Pulmonary effort is normal. No respiratory distress.     Breath sounds: Normal breath sounds. No wheezing, rhonchi or rales.  Abdominal:     General: Bowel sounds are normal. There is no distension.     Palpations: Abdomen is soft.     Tenderness: There is no abdominal tenderness. There is no guarding or rebound.  Musculoskeletal:     Cervical back: Neck supple.     Right lower leg: No edema.     Left lower leg: No edema.  Skin:    General: Skin is warm and dry.     Coloration: Skin is not jaundiced or pale.  Neurological:     General: No focal deficit present.     Mental Status: She is alert and oriented to person, place, and time. Mental status is at baseline.  Psychiatric:        Mood and Affect: Mood normal.        Behavior: Behavior normal.        Thought Content: Thought content normal.        Judgment: Judgment normal.       Assessment:  Ms. Heather Simmons is a 71 y.o. female  who presents today for Colonoscopy for Screening colonoscopy .  Plan:  Colonoscopy with possible intervention today  Colonoscopy with possible biopsy, control of bleeding, polypectomy, and interventions as necessary has been discussed with the patient/patient representative. Informed consent was obtained from the patient/patient representative after explaining the indication, nature, and risks of the procedure including but not limited to death, bleeding, perforation, missed neoplasm/lesions, cardiorespiratory compromise, and reaction to medications. Opportunity for questions was given and appropriate answers were provided. Patient/patient representative has verbalized understanding is amenable to undergoing the procedure.   Annamaria Helling, DO  [facility]  Portions of the record may have been created with voice recognition software. Occasional wrong-word or 'sound-a-like' substitutions may have occurred due to the inherent limitations of voice recognition software.  Read the chart carefully and recognize, using context, where substitutions may have occurred.

## 2022-04-12 ENCOUNTER — Encounter
Admission: RE | Disposition: A | Discharge status: Home / Self Care | Payer: Self-pay | Source: Home / Self Care | Attending: Gastroenterology

## 2022-04-12 ENCOUNTER — Ambulatory Visit: Payer: Medicare Other | Admitting: Anesthesiology

## 2022-04-12 ENCOUNTER — Ambulatory Visit
Admission: RE | Admit: 2022-04-12 | Discharge: 2022-04-12 | Disposition: A | Discharge status: Home / Self Care | Payer: Medicare Other | Attending: Gastroenterology | Admitting: Gastroenterology

## 2022-04-12 ENCOUNTER — Encounter: Payer: Self-pay | Admitting: Gastroenterology

## 2022-04-12 DIAGNOSIS — K6389 Other specified diseases of intestine: Secondary | ICD-10-CM | POA: Diagnosis not present

## 2022-04-12 DIAGNOSIS — D128 Benign neoplasm of rectum: Secondary | ICD-10-CM | POA: Diagnosis not present

## 2022-04-12 DIAGNOSIS — D122 Benign neoplasm of ascending colon: Secondary | ICD-10-CM | POA: Diagnosis not present

## 2022-04-12 DIAGNOSIS — K56699 Other intestinal obstruction unspecified as to partial versus complete obstruction: Secondary | ICD-10-CM | POA: Insufficient documentation

## 2022-04-12 DIAGNOSIS — G43909 Migraine, unspecified, not intractable, without status migrainosus: Secondary | ICD-10-CM | POA: Diagnosis not present

## 2022-04-12 DIAGNOSIS — K640 First degree hemorrhoids: Secondary | ICD-10-CM | POA: Insufficient documentation

## 2022-04-12 DIAGNOSIS — Z1211 Encounter for screening for malignant neoplasm of colon: Secondary | ICD-10-CM | POA: Diagnosis present

## 2022-04-12 DIAGNOSIS — D125 Benign neoplasm of sigmoid colon: Secondary | ICD-10-CM | POA: Diagnosis not present

## 2022-04-12 DIAGNOSIS — K5730 Diverticulosis of large intestine without perforation or abscess without bleeding: Secondary | ICD-10-CM | POA: Insufficient documentation

## 2022-04-12 DIAGNOSIS — I10 Essential (primary) hypertension: Secondary | ICD-10-CM | POA: Insufficient documentation

## 2022-04-12 HISTORY — PX: COLONOSCOPY WITH PROPOFOL: SHX5780

## 2022-04-12 SURGERY — COLONOSCOPY WITH PROPOFOL
Anesthesia: General

## 2022-04-12 MED ORDER — GLYCOPYRROLATE 0.2 MG/ML IJ SOLN
INTRAMUSCULAR | Status: AC
  Filled 2022-04-12: qty 1

## 2022-04-12 MED ORDER — SODIUM CHLORIDE 0.9 % IV SOLN
INTRAVENOUS | Status: DC
  Administered 2022-04-12: 100 mL via INTRAVENOUS

## 2022-04-12 MED ORDER — LIDOCAINE HCL (PF) 1 % IJ SOLN
INTRAMUSCULAR | Status: AC
  Filled 2022-04-12: qty 2

## 2022-04-12 MED ORDER — PROPOFOL 10 MG/ML IV BOLUS
INTRAVENOUS | Status: DC | PRN
  Administered 2022-04-12: 30 mg via INTRAVENOUS
  Administered 2022-04-12: 70 mg via INTRAVENOUS

## 2022-04-12 MED ORDER — PROPOFOL 1000 MG/100ML IV EMUL
INTRAVENOUS | Status: AC
  Filled 2022-04-12: qty 100

## 2022-04-12 MED ORDER — LIDOCAINE 2% (20 MG/ML) 5 ML SYRINGE
INTRAMUSCULAR | Status: DC | PRN
  Administered 2022-04-12: 20 mg via INTRAVENOUS

## 2022-04-12 MED ORDER — PROPOFOL 500 MG/50ML IV EMUL
INTRAVENOUS | Status: DC | PRN
  Administered 2022-04-12: 120 ug/kg/min via INTRAVENOUS

## 2022-04-12 NOTE — Op Note (Signed)
[facility] Gastroenterology Patient Name: Heather Simmons Procedure Date: 04/12/2022 9:41 AM MRN: [id number] Account #: [id number] Date of Birth: [birth year] Admit Type: Outpatient Age: 71 Room: [facility] ENDO ROOM 1 Gender: Female Note Status: Finalized Instrument Name: Colonoscope [id number] Procedure:             Colonoscopy Indications:           Screening for colorectal malignant neoplasm Providers:             Rueben Bash, DO Referring MD:          Annamaria Helling DO, DO (Referring MD), Dion Body (Referring MD) Medicines:             Monitored Anesthesia Care Complications:         No immediate complications. Estimated blood loss:                         Minimal. Procedure:             Pre-Anesthesia Assessment:                        - Prior to the procedure, a History and Physical was                         performed, and patient medications and allergies were                         reviewed. The patient is competent. The risks and                         benefits of the procedure and the sedation options and                         risks were discussed with the patient. All questions                         were answered and informed consent was obtained.                         Patient identification and proposed procedure were                         verified by the physician, the nurse, the anesthetist                         and the technician in the endoscopy suite. Mental                         Status Examination: alert and oriented. Airway                         Examination: normal oropharyngeal airway and neck                         mobility. Respiratory Examination: clear to  auscultation. CV Examination: RRR, no murmurs, no S3                         or S4. Prophylactic Antibiotics: The patient does not                         require prophylactic antibiotics. Prior                          Anticoagulants: The patient has taken no anticoagulant                         or antiplatelet agents. ASA Grade Assessment: III - A                         patient with severe systemic disease. After reviewing                         the risks and benefits, the patient was deemed in                         satisfactory condition to undergo the procedure. The                         anesthesia plan was to use monitored anesthesia care                         (MAC). Immediately prior to administration of                         medications, the patient was re-assessed for adequacy                         to receive sedatives. The heart rate, respiratory                         rate, oxygen saturations, blood pressure, adequacy of                         pulmonary ventilation, and response to care were                         monitored throughout the procedure. The physical                         status of the patient was re-assessed after the                         procedure.                        After obtaining informed consent, the colonoscope was                         passed under direct vision. Throughout the procedure,                         the patient's blood pressure, pulse, and oxygen  saturations were monitored continuously. The                         Colonoscope was introduced through the anus and                         advanced to the the cecum, identified by appendiceal                         orifice and ileocecal valve. The colonoscopy was                         somewhat difficult due to multiple diverticula in the                         colon and bowel stenosis. Successful completion of the                         procedure was aided by withdrawing the scope and                         replacing with the pediatric colonoscope. The patient                         tolerated the procedure well. The quality of the bowel                          preparation was evaluated using the BBPS [facility] Bowel                         Preparation Scale) with scores of: Right Colon = 1                         (portion of mucosa seen, but other areas not well seen                         due to staining, residual stool and/or opaque liquid),                         Transverse Colon = 2 (minor amount of residual                         staining, small fragments of stool and/or opaque                         liquid, but mucosa seen well) and Left Colon = 2                         (minor amount of residual staining, small fragments of                         stool and/or opaque liquid, but mucosa seen well). The                         total BBPS score equals 5. The quality of the bowel  preparation was inadequate. The ileocecal valve,                         appendiceal orifice, and rectum were photographed. Findings:      The perianal and digital rectal examinations were normal. Pertinent       negatives include normal sphincter tone.      Non-bleeding internal hemorrhoids were found during retroflexion. The       hemorrhoids were Grade I (internal hemorrhoids that do not prolapse).       Estimated blood loss: none.      Multiple small-mouthed diverticula were found in the entire colon. most       prominent in the left colon Estimated blood loss: none.      A benign-appearing, intrinsic moderate stenosis measuring 1 cm (in       length) x 1 cm (inner diameter) was found at 25 cm proximal to the anus       and was traversed. Traversed after change to pediatric colonoscope.      Localized mild inflammation characterized by granularity was found at 25       cm proximal to the anus. Due to presence at the stenosis and difficult       visualization, this area was biopsied to rule out polyp vs inflammation.       Biopsies were taken with a cold forceps for histology. Estimated blood       loss was minimal.      Two  sessile polyps were found in the rectum and ascending colon. The       polyps were 3 to 6 mm in size. These polyps were removed with a cold       snare. Resection and retrieval were complete. Estimated blood loss was       minimal.      A moderate amount of stool was found in the entire colon, interfering       with visualization. Lavage of the area was performed using a large       amount, resulting in incomplete clearance with fair visualization.       Estimated blood loss: none.      The exam was otherwise without abnormality on direct and retroflexion       views.      Food particulate interfered visualization. Able to lavage, however,       cannot rule out all small to medium sized lesions due to large amounts       of food and liquid stool. Estimated blood loss was minimal.      The exam was otherwise without abnormality on direct and retroflexion       views. Impression:            - Preparation of the colon was inadequate.                        - Non-bleeding internal hemorrhoids.                        - Diverticulosis in the entire examined colon.                        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                        -  Localized mild inflammation was found at 25 cm                         proximal to the anus. Biopsied.                        - Two 3 to 6 mm polyps in the rectum and in the                         ascending colon, removed with a cold snare. Resected                         and retrieved.                        - Stool in the entire examined colon.                        - The examination was otherwise normal on direct and                         retroflexion views.                        - The examination was otherwise normal on direct and                         retroflexion views. Recommendation:        - Patient has a contact number available for                         emergencies. The signs and symptoms of potential                          delayed complications were discussed with the patient.                         Return to normal activities tomorrow. Written                         discharge instructions were provided to the patient.                        - Discharge patient to home.                        - Resume previous diet.                        - Continue present medications.                        - Await pathology results.                        - Repeat colonoscopy in 1 year for surveillance based                         on pathology results.                        -  Return to GI office as previously scheduled.                        - Will consider surgical consultation for stenosis.                        - The findings and recommendations were discussed with                         the patient. Procedure Code(s):     --- Professional ---                        [phone], Colonoscopy, flexible; with removal of                         tumor(s), polyp(s), or other lesion(s) by snare                         technique                        45380, 51, Colonoscopy, flexible; with biopsy, single                         or multiple Diagnosis Code(s):     --- Professional ---                        Z12.11, Encounter for screening for malignant neoplasm                         of colon                        K64.0, First degree hemorrhoids                        K56.699, Other intestinal obstruction unspecified as                         to partial versus complete obstruction                        K52.9, Noninfective gastroenteritis and colitis,                         unspecified                        D12.8, Benign neoplasm of rectum                        D12.2, Benign neoplasm of ascending colon                        K57.30, Diverticulosis of large intestine without                         perforation or abscess without bleeding CPT copyright 2022 American [employer] Association. All rights reserved. The  codes documented in this report are preliminary and upon coder review may  be revised to meet current compliance requirements. Attending Participation:  I personally performed the entire procedure. Volney American, DO Annamaria Helling DO, DO 04/12/2022 10:52:09 AM This report has been signed electronically. Number of Addenda: 0 Note Initiated On: 04/12/2022 9:41 AM Scope Withdrawal Time: 0 hours 18 minutes 49 seconds  Total Procedure Duration: 0 hours 37 minutes 13 seconds  Estimated Blood Loss:  Estimated blood loss was minimal.      [facility]

## 2022-04-12 NOTE — Interval H&P Note (Signed)
History and Physical Interval Note: Preprocedure H&P from 04/12/22  was reviewed and there was no interval change after seeing and examining the patient.  Written consent was obtained from the patient after discussion of risks, benefits, and alternatives. Patient has consented to proceed with Colonoscopy with possible intervention   04/12/2022 9:55 AM  Heather Simmons  has presented today for surgery, with the diagnosis of Colon Cancer Screening.  The various methods of treatment have been discussed with the patient and family. After consideration of risks, benefits and other options for treatment, the patient has consented to  Procedure(s): COLONOSCOPY WITH PROPOFOL (N/A) as a surgical intervention.  The patient's history has been reviewed, patient examined, no change in status, stable for surgery.  I have reviewed the patient's chart and labs.  Questions were answered to the patient's satisfaction.     Annamaria Helling

## 2022-04-12 NOTE — Transfer of Care (Signed)
Immediate Anesthesia Transfer of Care Note  Patient: Heather Simmons  Procedure(s) Performed: COLONOSCOPY WITH PROPOFOL  Patient Location: PACU  Anesthesia Type:General  Level of Consciousness: awake, alert , and oriented  Airway & Oxygen Therapy: Patient Spontanous Breathing  Post-op Assessment: Report given to RN and Post -op Vital signs reviewed and stable  Post vital signs: Reviewed  Last Vitals:  Vitals Value Taken Time  BP    Temp    Pulse 65 04/12/22 1046  Resp 26 04/12/22 1046  SpO2 100 % 04/12/22 1046  Vitals shown include unvalidated device data.  Last Pain:  Vitals:   04/12/22 0916  TempSrc: Temporal  PainSc: 0-No pain         Complications: No notable events documented.

## 2022-04-12 NOTE — Anesthesia Preprocedure Evaluation (Signed)
Anesthesia Evaluation  Patient identified by MRN, date of birth, ID band Patient awake    Reviewed: Allergy & Precautions, NPO status , Patient's Chart, lab work & pertinent test results  History of Anesthesia Complications (+) PONV and history of anesthetic complications  Airway Mallampati: III  TM Distance: <3 FB Neck ROM: full    Dental  (+) Chipped, Poor Dentition, Missing   Pulmonary neg pulmonary ROS, neg shortness of breath   Pulmonary exam normal        Cardiovascular Exercise Tolerance: Good hypertension, (-) angina Normal cardiovascular exam+ Valvular Problems/Murmurs      Neuro/Psych  Headaches  negative psych ROS   GI/Hepatic negative GI ROS, Neg liver ROS,neg GERD  ,,  Endo/Other  negative endocrine ROS    Renal/GU Renal disease  negative genitourinary   Musculoskeletal   Abdominal   Peds  Hematology negative hematology ROS (+)   Anesthesia Other Findings Past Medical History: No date: Anginal pain ([facility]) No date: Complication of anesthesia     Comment:  Nausea postoperatively with cataract surgery No date: Fluid collection (edema) in the arms, legs, hands and feet No date: Gout No date: Headache     Comment:  MIGRAINES No date: Heart murmur No date: History of kidney stones No date: Hypertension No date: Palpitations  Past Surgical History: No date: ABDOMINAL HYSTERECTOMY 10/04/2015: CATARACT EXTRACTION W/PHACO; Right     Comment:  Procedure: CATARACT EXTRACTION PHACO AND INTRAOCULAR               LENS PLACEMENT ([facility]);  Surgeon: Birder Robson, MD;                Location: [facility] ORS;  Service: Ophthalmology;  Laterality:              Right;  [place] 36.0 AP% 21.9 CDE 7.88 fluid pack lot #               [id number] 11/01/2015: CATARACT EXTRACTION W/PHACO; Left     Comment:  Procedure: CATARACT EXTRACTION PHACO AND INTRAOCULAR               LENS PLACEMENT ([facility]);  Surgeon: Birder Robson, MD;                 Location: [facility] OR[facility];  Service: Ophthalmology;  Laterality:              Left;  [place] 00:33 AP% 23.1 CDE 7.76 fluid pack lot #               [id number] 06/18/2020: CYSTOSCOPY WITH STENT PLACEMENT; Right     Comment:  Procedure: [place];  Surgeon:               Irine Seal, MD;  Location: [facility];  Service: Urology;               Laterality: Right; 07/15/2020: CYSTOSCOPY/URETEROSCOPY/HOLMIUM LASER/STENT PLACEMENT;  Right     Comment:  Procedure: CYSTOSCOPY/URETEROSCOPY/HOLMIUM LASER/STENT               PLACEMENT;  Surgeon: Billey Co, MD;  Location:               [facility] ORS;  Service: Urology;  Laterality: Right; No date: EYE SURGERY No date: OOPHORECTOMY  BMI    Body Mass Index: 38.62 kg/m      Reproductive/Obstetrics negative OB ROS  Anesthesia Physical Anesthesia Plan  ASA: 3  Anesthesia Plan: General   Post-op Pain Management:    Induction: Intravenous  PONV Risk Score and Plan: Propofol infusion and TIVA  Airway Management Planned: Natural Airway and Nasal Cannula  Additional Equipment:   Intra-op Plan:   Post-operative Plan:   Informed Consent: I have reviewed the patients History and Physical, chart, labs and discussed the procedure including the risks, benefits and alternatives for the proposed anesthesia with the patient or authorized representative who has indicated his/her understanding and acceptance.     Dental Advisory Given  Plan Discussed with: Anesthesiologist, CRNA and Surgeon  Anesthesia Plan Comments: (Patient consented for risks of anesthesia including but not limited to:  - adverse reactions to medications - risk of airway placement if required - damage to eyes, teeth, lips or other oral mucosa - nerve damage due to positioning  - sore throat or hoarseness - Damage to heart, brain, nerves, lungs, other parts of body or loss of life  Patient voiced  understanding.)       Anesthesia Quick Evaluation

## 2022-04-12 NOTE — Anesthesia Postprocedure Evaluation (Signed)
Anesthesia Post Note  Patient: Heather Simmons  Procedure(s) Performed: COLONOSCOPY WITH PROPOFOL  Patient location during evaluation: Endoscopy Anesthesia Type: General Level of consciousness: awake and alert Pain management: pain level controlled Vital Signs Assessment: post-procedure vital signs reviewed and stable Respiratory status: spontaneous breathing, nonlabored ventilation, respiratory function stable and patient connected to nasal cannula oxygen Cardiovascular status: blood pressure returned to baseline and stable Postop Assessment: no apparent nausea or vomiting Anesthetic complications: no   No notable events documented.   Last Vitals:  Vitals:   04/12/22 1105 04/12/22 1115  BP: (!) 103/56 102/70  Pulse: 61 (!) 53  Resp: (!) 23 20  Temp:    SpO2: 98% 99%    Last Pain:  Vitals:   04/12/22 1115  TempSrc:   PainSc: 0-No pain                 Precious Haws Brian Kocourek

## 2022-04-13 ENCOUNTER — Encounter: Payer: Self-pay | Admitting: Gastroenterology

## 2022-04-13 LAB — SURGICAL PATHOLOGY

## 2022-11-30 ENCOUNTER — Encounter: Payer: Self-pay | Admitting: Gastroenterology

## 2022-12-02 NOTE — H&P (Signed)
Pre-Procedure H&P   Patient ID: Heather Simmons is a 72 y.o. female.  Gastroenterology Provider: Jaynie Collins, DO  Referring Provider: Tawni Pummel, PA PCP: Marisue Ivan, MD  Date: 12/03/2022  HPI Heather Simmons is a 72 y.o. female who presents today for Colonoscopy for Surveillance-personal history of colon polyps .  Patient underwent colonoscopy in December 2023 demonstrating 1 tubular adenoma and 1 sessile serrated polyp.  Bowel prep was limited.  Pandiverticulosis was noted.  Internal hemorrhoids were also appreciated. She was noted to have a stricture at 25 cm from the anus with inflammation.  Biopsies were negative this area.  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  No family history of colon cancer or colon polyps  She is status post hysterectomy  Constipation has improved with initiation of Golo where she has 2 bowel moods per day without melena or hematochezia.   Past Medical History:  Diagnosis Date   Anginal pain (HCC)    Complication of anesthesia    Nausea postoperatively with cataract surgery   Fluid collection (edema) in the arms, legs, hands and feet    Gout    Headache    MIGRAINES   Heart murmur    History of kidney stones    Hypertension    Palpitations     Past Surgical History:  Procedure Laterality Date   ABDOMINAL HYSTERECTOMY     CATARACT EXTRACTION W/PHACO Right 10/04/2015   Procedure: CATARACT EXTRACTION PHACO AND INTRAOCULAR LENS PLACEMENT (IOC);  Surgeon: Galen Manila, MD;  Location: [facility] ORS;  Service: Ophthalmology;  Laterality: Right;  [place] 36.0 AP% 21.9 CDE 7.88 fluid pack lot # [id number]   CATARACT EXTRACTION W/PHACO Left 11/01/2015   Procedure: CATARACT EXTRACTION PHACO AND INTRAOCULAR LENS PLACEMENT (IOC);  Surgeon: Galen Manila, MD;  Location: [facility] ORS;  Service: Ophthalmology;  Laterality: Left;  [place] 00:33 AP% 23.1 CDE 7.76 fluid pack lot # [id number]   COLONOSCOPY WITH PROPOFOL N/A 04/12/2022    Procedure: COLONOSCOPY WITH PROPOFOL;  Surgeon: Jaynie Collins, DO;  Location: [facility] ENDOSCOPY;  Service: Gastroenterology;  Laterality: N/A;   CYSTOSCOPY WITH STENT PLACEMENT Right 06/18/2020   Procedure: CYSTOSCOPY WITH STENT PLACEMENT;  Surgeon: Bjorn Pippin, MD;  Location: [facility] ORS;  Service: Urology;  Laterality: Right;   CYSTOSCOPY/URETEROSCOPY/HOLMIUM LASER/STENT PLACEMENT Right 07/15/2020   Procedure: CYSTOSCOPY/URETEROSCOPY/HOLMIUM LASER/STENT PLACEMENT;  Surgeon: Sondra Come, MD;  Location: [facility] ORS;  Service: Urology;  Laterality: Right;   EYE SURGERY     OOPHORECTOMY      Family History No h/o GI disease or malignancy  Review of Systems  Constitutional:  Negative for activity change, appetite change, chills, diaphoresis, fatigue, fever and unexpected weight change.  HENT:  Negative for trouble swallowing and voice change.   Respiratory:  Negative for shortness of breath and wheezing.   Cardiovascular:  Negative for chest pain, palpitations and leg swelling.  Gastrointestinal:  Negative for abdominal distention, abdominal pain, anal bleeding, blood in stool, constipation, diarrhea, nausea, rectal pain and vomiting.  Musculoskeletal:  Negative for arthralgias and myalgias.  Skin:  Negative for color change and pallor.  Neurological:  Negative for dizziness, syncope and weakness.  Psychiatric/Behavioral:  Negative for confusion.   All other systems reviewed and are negative.    Medications No current facility-administered medications on file prior to encounter.   Current Outpatient Medications on File Prior to Encounter  Medication Sig Dispense Refill   amLODipine (NORVASC) 10 MG tablet Take 10 mg by mouth at bedtime.  carvedilol (COREG) 6.25 MG tablet Take 6.25 mg by mouth 2 (two) times daily with a meal.     losartan (COZAAR) 100 MG tablet Take 100 mg by mouth at bedtime.      Omega-3 Fatty Acids (FISH OIL) 1200 MG CAPS Take 1,200 mg by mouth daily.      topiramate (TOPAMAX) 25 MG tablet Take 25 mg by mouth at bedtime.      Pertinent medications related to GI and procedure were reviewed by me with the patient prior to the procedure   Current Facility-Administered Medications:    0.9 %  sodium chloride infusion, , Intravenous, Continuous, Jaynie Collins, DO, Last Rate: 20 mL/hr at 12/03/22 0847, 1,000 mL at 12/03/22 0847  sodium chloride 1,000 mL (12/03/22 0847)       Allergies  Allergen Reactions   Lisinopril Other (See Comments)    Intolerance - HAs   Allergies were reviewed by me prior to the procedure  Objective   Body mass index is 31.88 kg/m. Vitals:   12/03/22 0841  BP: (!) 158/76  Pulse: 77  Resp: 20  Temp: (!) 96.5 F (35.8 C)  TempSrc: Temporal  SpO2: 98%  Weight: 112.6 kg  Height: 6\' 2"  (1.88 m)     Physical Exam Vitals and nursing note reviewed.  Constitutional:      General: She is not in acute distress.    Appearance: Normal appearance. She is obese. She is not ill-appearing, toxic-appearing or diaphoretic.  HENT:     Head: Normocephalic and atraumatic.     Nose: Nose normal.     Mouth/Throat:     Mouth: Mucous membranes are moist.     Pharynx: Oropharynx is clear.  Eyes:     General: No scleral icterus.    Extraocular Movements: Extraocular movements intact.  Cardiovascular:     Rate and Rhythm: Normal rate and regular rhythm.     Heart sounds: Normal heart sounds. No murmur heard.    No friction rub. No gallop.  Pulmonary:     Effort: Pulmonary effort is normal. No respiratory distress.     Breath sounds: Normal breath sounds. No wheezing, rhonchi or rales.  Abdominal:     General: Bowel sounds are normal. There is no distension.     Palpations: Abdomen is soft.     Tenderness: There is no abdominal tenderness. There is no guarding or rebound.  Musculoskeletal:     Cervical back: Neck supple.     Right lower leg: No edema.     Left lower leg: No edema.  Skin:    General: Skin is  warm and dry.     Coloration: Skin is not jaundiced or pale.  Neurological:     General: No focal deficit present.     Mental Status: She is alert and oriented to person, place, and time. Mental status is at baseline.  Psychiatric:        Mood and Affect: Mood normal.        Behavior: Behavior normal.        Thought Content: Thought content normal.        Judgment: Judgment normal.      Assessment:  Heather Simmons is a 72 y.o. female  who presents today for Colonoscopy for Surveillance-personal history of colon polyps .  Plan:  Colonoscopy with possible intervention today  Colonoscopy with possible biopsy, control of bleeding, polypectomy, and interventions as necessary has been discussed with the patient/patient representative. Informed consent  was obtained from the patient/patient representative after explaining the indication, nature, and risks of the procedure including but not limited to death, bleeding, perforation, missed neoplasm/lesions, cardiorespiratory compromise, and reaction to medications. Opportunity for questions was given and appropriate answers were provided. Patient/patient representative has verbalized understanding is amenable to undergoing the procedure.   Jaynie Collins, DO  [facility] Gastroenterology  Portions of the record may have been created with voice recognition software. Occasional wrong-word or 'sound-a-like' substitutions may have occurred due to the inherent limitations of voice recognition software.  Read the chart carefully and recognize, using context, where substitutions may have occurred.

## 2022-12-03 ENCOUNTER — Ambulatory Visit: Payer: Medicare Other | Admitting: Certified Registered"

## 2022-12-03 ENCOUNTER — Encounter
Admission: RE | Disposition: A | Discharge status: Home / Self Care | Payer: Self-pay | Source: Home / Self Care | Attending: Gastroenterology

## 2022-12-03 ENCOUNTER — Ambulatory Visit
Admission: RE | Admit: 2022-12-03 | Discharge: 2022-12-03 | Disposition: A | Discharge status: Home / Self Care | Payer: Medicare Other | Attending: Gastroenterology | Admitting: Gastroenterology

## 2022-12-03 ENCOUNTER — Encounter: Payer: Self-pay | Admitting: Gastroenterology

## 2022-12-03 DIAGNOSIS — Z09 Encounter for follow-up examination after completed treatment for conditions other than malignant neoplasm: Secondary | ICD-10-CM | POA: Insufficient documentation

## 2022-12-03 DIAGNOSIS — Z86010 Personal history of colonic polyps: Secondary | ICD-10-CM | POA: Insufficient documentation

## 2022-12-03 DIAGNOSIS — Z79899 Other long term (current) drug therapy: Secondary | ICD-10-CM | POA: Diagnosis not present

## 2022-12-03 DIAGNOSIS — K5730 Diverticulosis of large intestine without perforation or abscess without bleeding: Secondary | ICD-10-CM | POA: Diagnosis not present

## 2022-12-03 DIAGNOSIS — K640 First degree hemorrhoids: Secondary | ICD-10-CM | POA: Diagnosis not present

## 2022-12-03 DIAGNOSIS — K624 Stenosis of anus and rectum: Secondary | ICD-10-CM | POA: Diagnosis not present

## 2022-12-03 DIAGNOSIS — I10 Essential (primary) hypertension: Secondary | ICD-10-CM | POA: Diagnosis not present

## 2022-12-03 DIAGNOSIS — Z6831 Body mass index (BMI) 31.0-31.9, adult: Secondary | ICD-10-CM | POA: Insufficient documentation

## 2022-12-03 HISTORY — PX: COLONOSCOPY WITH PROPOFOL: SHX5780

## 2022-12-03 SURGERY — COLONOSCOPY WITH PROPOFOL
Anesthesia: General

## 2022-12-03 MED ORDER — SODIUM CHLORIDE 0.9 % IV SOLN
INTRAVENOUS | Status: DC
  Administered 2022-12-03: 1000 mL via INTRAVENOUS

## 2022-12-03 MED ORDER — PROPOFOL 10 MG/ML IV BOLUS
INTRAVENOUS | Status: DC | PRN
  Administered 2022-12-03: 50 mg via INTRAVENOUS

## 2022-12-03 MED ORDER — LIDOCAINE HCL (CARDIAC) PF 100 MG/5ML IV SOSY
PREFILLED_SYRINGE | INTRAVENOUS | Status: DC | PRN
  Administered 2022-12-03: 50 mg via INTRAVENOUS

## 2022-12-03 MED ORDER — DEXMEDETOMIDINE HCL IN NACL 80 MCG/20ML IV SOLN
INTRAVENOUS | Status: DC | PRN
  Administered 2022-12-03: 8 ug via INTRAVENOUS

## 2022-12-03 MED ORDER — PROPOFOL 500 MG/50ML IV EMUL
INTRAVENOUS | Status: DC | PRN
  Administered 2022-12-03: 150 ug/kg/min via INTRAVENOUS

## 2022-12-03 NOTE — Interval H&P Note (Signed)
History and Physical Interval Note: Preprocedure H&P from 12/03/22  was reviewed and there was no interval change after seeing and examining the patient.  Written consent was obtained from the patient after discussion of risks, benefits, and alternatives. Patient has consented to proceed with Colonoscopy with possible intervention   12/03/2022 9:16 AM  [name]  has presented today for surgery, with the diagnosis of V12.72 (ICD-9-CM) - Z86.010 (ICD-10-CM) - Personal history of colonic polyps.  The various methods of treatment have been discussed with the patient and family. After consideration of risks, benefits and other options for treatment, the patient has consented to  Procedure(s): COLONOSCOPY WITH PROPOFOL (N/A) as a surgical intervention.  The patient's history has been reviewed, patient examined, no change in status, stable for surgery.  I have reviewed the patient's chart and labs.  Questions were answered to the patient's satisfaction.     Heather Simmons

## 2022-12-03 NOTE — Anesthesia Procedure Notes (Signed)
Procedure Name: MAC Date/Time: 12/03/2022 9:19 AM  Performed by: Cheral Bay, CRNAPre-anesthesia Checklist: Patient identified, Emergency Drugs available, Suction available, Patient being monitored and Timeout performed Patient Re-evaluated:Patient Re-evaluated prior to induction Oxygen Delivery Method: Nasal cannula Induction Type: IV induction Placement Confirmation: positive ETCO2 and CO2 detector

## 2022-12-03 NOTE — Op Note (Signed)
[facility] Gastroenterology Patient Name: Heather Simmons Procedure Date: 12/03/2022 9:16 AM MRN: [id number] Account #: [id number] Date of Birth: November 21, 1950 Admit Type: Outpatient Age: 71 Room: [facility] ENDO ROOM 2 Gender: Female Note Status: Finalized Instrument Name: Peds Colonoscope [id number] Procedure:             Colonoscopy Indications:           High risk colon cancer surveillance: Personal history                         of colonic polyps Providers:             Trenda Moots, DO Referring MD:          Marisue Ivan (Referring MD) Medicines:             Monitored Anesthesia Care Complications:         No immediate complications. Estimated blood loss: None. Procedure:             Pre-Anesthesia Assessment:                        - Prior to the procedure, a History and Physical was                         performed, and patient medications and allergies were                         reviewed. The patient is competent. The risks and                         benefits of the procedure and the sedation options and                         risks were discussed with the patient. All questions                         were answered and informed consent was obtained.                         Patient identification and proposed procedure were                         verified by the physician, the nurse, the anesthetist                         and the technician in the endoscopy suite. Mental                         Status Examination: alert and oriented. Airway                         Examination: normal oropharyngeal airway and neck                         mobility. Respiratory Examination: clear to                         auscultation. CV Examination: RRR, no murmurs, no S3  or S4. Prophylactic Antibiotics: The patient does not                         require prophylactic antibiotics. Prior                         Anticoagulants: The patient  has taken no anticoagulant                         or antiplatelet agents. ASA Grade Assessment: III - A                         patient with severe systemic disease. After reviewing                         the risks and benefits, the patient was deemed in                         satisfactory condition to undergo the procedure. The                         anesthesia plan was to use monitored anesthesia care                         (MAC). Immediately prior to administration of                         medications, the patient was re-assessed for adequacy                         to receive sedatives. The heart rate, respiratory                         rate, oxygen saturations, blood pressure, adequacy of                         pulmonary ventilation, and response to care were                         monitored throughout the procedure. The physical                         status of the patient was re-assessed after the                         procedure.                        After obtaining informed consent, the colonoscope was                         passed under direct vision. Throughout the procedure,                         the patient's blood pressure, pulse, and oxygen                         saturations were monitored continuously. The  Colonoscope was introduced through the anus and                         advanced to the the cecum, identified by appendiceal                         orifice and ileocecal valve. The colonoscopy was                         performed without difficulty. The patient tolerated                         the procedure well. The quality of the bowel                         preparation was evaluated using the BBPS [facility] Bowel                         Preparation Scale) with scores of: Right Colon = 2                         (minor amount of residual staining, small fragments of                         stool and/or opaque liquid, but mucosa  seen well),                         Transverse Colon = 2 (minor amount of residual                         staining, small fragments of stool and/or opaque                         liquid, but mucosa seen well) and Left Colon = 2                         (minor amount of residual staining, small fragments of                         stool and/or opaque liquid, but mucosa seen well). The                         total BBPS score equals 6. The quality of the bowel                         preparation was good. The ileocecal valve, appendiceal                         orifice, and rectum were photographed. Findings:      The perianal and digital rectal examinations were normal. Pertinent       negatives include normal sphincter tone.      Multiple small-mouthed diverticula were found in the entire colon.       Estimated blood loss: none.      Non-bleeding internal hemorrhoids were found during retroflexion. The       hemorrhoids were Grade I (internal hemorrhoids that do not prolapse).  Estimated blood loss: none.      A benign-appearing, intrinsic moderate stenosis measuring 2 cm (in       length) x 1.3 cm (inner diameter) was found at 25 cm proximal to the       anus and was traversed. Estimated blood loss: none.      The exam was otherwise without abnormality on direct and retroflexion       views. Impression:            - Diverticulosis in the entire examined colon.                        - Non-bleeding internal hemorrhoids.                        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                        - The examination was otherwise normal on direct and                         retroflexion views.                        - No specimens collected. Recommendation:        - Patient has a contact number available for                         emergencies. The signs and symptoms of potential                         delayed complications were discussed with the patient.                          Return to normal activities tomorrow. Written                         discharge instructions were provided to the patient.                        - Discharge patient to home.                        - Resume previous diet.                        - Continue present medications.                        - Repeat colonoscopy in 5 years for surveillance.                        - Return to GI office PRN.                        - The findings and recommendations were discussed with                         the patient. Procedure Code(s):     --- Professional ---  40981, Colonoscopy, flexible; diagnostic, including                         collection of specimen(s) by brushing or washing, when                         performed (separate procedure) Diagnosis Code(s):     --- Professional ---                        Z86.010, Personal history of colonic polyps                        K64.0, First degree hemorrhoids                        K56.699, Other intestinal obstruction unspecified as                         to partial versus complete obstruction                        K57.30, Diverticulosis of large intestine without                         perforation or abscess without bleeding CPT copyright 2022 American [employer] Association. All rights reserved. The codes documented in this report are preliminary and upon coder review may  be revised to meet current compliance requirements. Attending Participation:      I personally performed the entire procedure. Elfredia Nevins, DO Jaynie Collins DO, DO 12/03/2022 9:48:54 AM This report has been signed electronically. Number of Addenda: 0 Note Initiated On: 12/03/2022 9:16 AM Scope Withdrawal Time: 0 hours 10 minutes 59 seconds  Total Procedure Duration: 0 hours 16 minutes 5 seconds  Estimated Blood Loss:  Estimated blood loss: none.      [facility]

## 2022-12-03 NOTE — Transfer of Care (Signed)
Immediate Anesthesia Transfer of Care Note  Patient: Heather Simmons  Procedure(s) Performed: COLONOSCOPY WITH PROPOFOL  Patient Location: PACU and Endoscopy Unit  Anesthesia Type:General  Level of Consciousness: sedated  Airway & Oxygen Therapy: Patient Spontanous Breathing  Post-op Assessment: Report given to RN and Post -op Vital signs reviewed and stable  Post vital signs: Reviewed and stable  Last Vitals:  Vitals Value Taken Time  BP 104/61 12/03/22 0945  Temp 35.6 C 12/03/22 0945  Pulse 74 12/03/22 0945  Resp 18 12/03/22 0945  SpO2 97 % 12/03/22 0945  Vitals shown include unfiled device data.  Last Pain:  Vitals:   12/03/22 0945  TempSrc: Temporal  PainSc: 0-No pain         Complications: No notable events documented.

## 2022-12-03 NOTE — Anesthesia Preprocedure Evaluation (Signed)
Anesthesia Evaluation  Patient identified by MRN, date of birth, ID band Patient awake    Reviewed: Allergy & Precautions, NPO status , Patient's Chart, lab work & pertinent test results  History of Anesthesia Complications (+) history of anesthetic complications  Airway Mallampati: III  TM Distance: <3 FB Neck ROM: full    Dental  (+) Edentulous Lower, Implants   Pulmonary neg pulmonary ROS   Pulmonary exam normal breath sounds clear to auscultation       Cardiovascular Exercise Tolerance: Good hypertension, Pt. on medications + angina  negative cardio ROS Normal cardiovascular exam+ Valvular Problems/Murmurs  Rhythm:Regular Rate:Normal     Neuro/Psych negative neurological ROS  negative psych ROS   GI/Hepatic negative GI ROS, Neg liver ROS,,,  Endo/Other  negative endocrine ROS  Morbid obesity  Renal/GU Renal diseasenegative Renal ROS  negative genitourinary   Musculoskeletal   Abdominal  (+) + obese  Peds negative pediatric ROS (+)  Hematology negative hematology ROS (+)   Anesthesia Other Findings Past Medical History: No date: Anginal pain (HCC) No date: Complication of anesthesia     Comment:  Nausea postoperatively with cataract surgery No date: Fluid collection (edema) in the arms, legs, hands and feet No date: Gout No date: Headache     Comment:  MIGRAINES No date: Heart murmur No date: History of kidney stones No date: Hypertension No date: Palpitations  Past Surgical History: No date: ABDOMINAL HYSTERECTOMY 10/04/2015: CATARACT EXTRACTION W/PHACO; Right     Comment:  Procedure: CATARACT EXTRACTION PHACO AND INTRAOCULAR               LENS PLACEMENT (IOC);  Surgeon: Galen Manila, MD;                Location: [facility] ORS;  Service: Ophthalmology;  Laterality:              Right;  [place] 36.0 AP% 21.9 CDE 7.88 fluid pack lot #               [id number] 11/01/2015: CATARACT EXTRACTION W/PHACO;  Left     Comment:  Procedure: CATARACT EXTRACTION PHACO AND INTRAOCULAR               LENS PLACEMENT (IOC);  Surgeon: Galen Manila, MD;                Location: [facility] ORS;  Service: Ophthalmology;  Laterality:              Left;  [place] 00:33 AP% 23.1 CDE 7.76 fluid pack lot #               [id number] 04/12/2022: COLONOSCOPY WITH PROPOFOL; N/A     Comment:  Procedure: COLONOSCOPY WITH PROPOFOL;  Surgeon: Jaynie Collins, DO;  Location: [facility] ENDOSCOPY;  Service:               Gastroenterology;  Laterality: N/A; 06/18/2020: CYSTOSCOPY WITH STENT PLACEMENT; Right     Comment:  Procedure: CYSTOSCOPY WITH STENT PLACEMENT;  Surgeon:               Bjorn Pippin, MD;  Location: [facility] ORS;  Service: Urology;               Laterality: Right; 07/15/2020: CYSTOSCOPY/URETEROSCOPY/HOLMIUM LASER/STENT PLACEMENT;  Right     Comment:  Procedure: CYSTOSCOPY/URETEROSCOPY/HOLMIUM LASER/STENT               PLACEMENT;  Surgeon: Sondra Come, MD;  Location:               [facility] ORS;  Service: Urology;  Laterality: Right; No date: EYE SURGERY No date: OOPHORECTOMY  BMI    Body Mass Index: 31.88 kg/m      Reproductive/Obstetrics negative OB ROS                             Anesthesia Physical Anesthesia Plan  ASA: 3  Anesthesia Plan: General   Post-op Pain Management:    Induction: Intravenous  PONV Risk Score and Plan: Propofol infusion and TIVA  Airway Management Planned: Natural Airway  Additional Equipment:   Intra-op Plan:   Post-operative Plan:   Informed Consent: I have reviewed the patients History and Physical, chart, labs and discussed the procedure including the risks, benefits and alternatives for the proposed anesthesia with the patient or authorized representative who has indicated his/her understanding and acceptance.     Dental Advisory Given  Plan Discussed with: CRNA and Surgeon  Anesthesia Plan Comments:         Anesthesia Quick Evaluation

## 2022-12-03 NOTE — Anesthesia Postprocedure Evaluation (Signed)
Anesthesia Post Note  Patient: Heather Simmons  Procedure(s) Performed: COLONOSCOPY WITH PROPOFOL  Patient location during evaluation: PACU Anesthesia Type: General Level of consciousness: awake and awake and alert Pain management: satisfactory to patient Vital Signs Assessment: post-procedure vital signs reviewed and stable Respiratory status: spontaneous breathing Cardiovascular status: blood pressure returned to baseline Anesthetic complications: no   No notable events documented.   Last Vitals:  Vitals:   12/03/22 0841 12/03/22 0945  BP: (!) 158/76 104/61  Pulse: 77   Resp: 20   Temp: (!) 35.8 C (!) 35.6 C  SpO2: 98%     Last Pain:  Vitals:   12/03/22 1005  TempSrc:   PainSc: 0-No pain                 VAN STAVEREN,Render Marley

## 2022-12-04 ENCOUNTER — Encounter: Payer: Self-pay | Admitting: Gastroenterology
